# Patient Record
Sex: Female | Born: 1975 | Race: White | Hispanic: No | Smoking: Current every day smoker
Health system: Southern US, Community
[De-identification: ages and names within clinical notes are randomized; demographics above are authoritative.]

## PROBLEM LIST (undated history)

## (undated) DIAGNOSIS — F419 Anxiety disorder, unspecified: Secondary | ICD-10-CM

## (undated) DIAGNOSIS — A770 Spotted fever due to Rickettsia rickettsii: Secondary | ICD-10-CM

## (undated) DIAGNOSIS — R569 Unspecified convulsions: Secondary | ICD-10-CM

## (undated) DIAGNOSIS — R55 Syncope and collapse: Secondary | ICD-10-CM

## (undated) DIAGNOSIS — A6920 Lyme disease, unspecified: Secondary | ICD-10-CM

## (undated) HISTORY — DX: Spotted fever due to Rickettsia rickettsii: A77.0

## (undated) HISTORY — PX: CHOLECYSTECTOMY: SHX55

## (undated) HISTORY — DX: Lyme disease, unspecified: A69.20

---

## 1998-07-13 ENCOUNTER — Emergency Department (HOSPITAL_COMMUNITY): Admission: EM | Admit: 1998-07-13 | Discharge: 1998-07-13 | Payer: Self-pay | Admitting: Emergency Medicine

## 1998-08-28 ENCOUNTER — Other Ambulatory Visit: Admission: RE | Admit: 1998-08-28 | Discharge: 1998-08-28 | Payer: Self-pay | Admitting: Obstetrics

## 1998-12-20 ENCOUNTER — Inpatient Hospital Stay (HOSPITAL_COMMUNITY): Admission: AD | Admit: 1998-12-20 | Discharge: 1998-12-20 | Payer: Self-pay | Admitting: Obstetrics & Gynecology

## 1998-12-25 ENCOUNTER — Inpatient Hospital Stay (HOSPITAL_COMMUNITY): Admission: AD | Admit: 1998-12-25 | Discharge: 1998-12-25 | Payer: Self-pay | Admitting: Obstetrics

## 1999-05-12 ENCOUNTER — Inpatient Hospital Stay (HOSPITAL_COMMUNITY): Admission: AD | Admit: 1999-05-12 | Discharge: 1999-05-12 | Payer: Self-pay | Admitting: Obstetrics

## 2000-07-08 ENCOUNTER — Emergency Department (HOSPITAL_COMMUNITY): Admission: EM | Admit: 2000-07-08 | Discharge: 2000-07-08 | Payer: Self-pay | Admitting: Emergency Medicine

## 2001-01-03 ENCOUNTER — Inpatient Hospital Stay (HOSPITAL_COMMUNITY): Admission: AD | Admit: 2001-01-03 | Discharge: 2001-01-03 | Payer: Self-pay | Admitting: Obstetrics

## 2001-01-04 ENCOUNTER — Encounter: Payer: Self-pay | Admitting: Obstetrics

## 2001-01-04 ENCOUNTER — Ambulatory Visit (HOSPITAL_COMMUNITY): Admission: RE | Admit: 2001-01-04 | Discharge: 2001-01-04 | Payer: Self-pay | Admitting: Obstetrics

## 2001-05-01 ENCOUNTER — Encounter: Payer: Self-pay | Admitting: Emergency Medicine

## 2001-05-01 ENCOUNTER — Emergency Department (HOSPITAL_COMMUNITY): Admission: EM | Admit: 2001-05-01 | Discharge: 2001-05-01 | Payer: Self-pay | Admitting: Emergency Medicine

## 2001-05-07 ENCOUNTER — Emergency Department (HOSPITAL_COMMUNITY): Admission: EM | Admit: 2001-05-07 | Discharge: 2001-05-07 | Payer: Self-pay | Admitting: Emergency Medicine

## 2001-09-11 ENCOUNTER — Encounter: Payer: Self-pay | Admitting: *Deleted

## 2001-09-11 ENCOUNTER — Emergency Department (HOSPITAL_COMMUNITY): Admission: EM | Admit: 2001-09-11 | Discharge: 2001-09-11 | Payer: Self-pay | Admitting: Emergency Medicine

## 2002-02-08 ENCOUNTER — Emergency Department (HOSPITAL_COMMUNITY): Admission: EM | Admit: 2002-02-08 | Discharge: 2002-02-09 | Payer: Self-pay

## 2002-02-12 ENCOUNTER — Emergency Department (HOSPITAL_COMMUNITY): Admission: EM | Admit: 2002-02-12 | Discharge: 2002-02-12 | Payer: Self-pay | Admitting: Emergency Medicine

## 2002-02-12 ENCOUNTER — Encounter: Payer: Self-pay | Admitting: Emergency Medicine

## 2004-06-06 ENCOUNTER — Emergency Department (HOSPITAL_COMMUNITY): Admission: EM | Admit: 2004-06-06 | Discharge: 2004-06-06 | Payer: Self-pay | Admitting: Emergency Medicine

## 2004-07-18 ENCOUNTER — Emergency Department (HOSPITAL_COMMUNITY): Admission: EM | Admit: 2004-07-18 | Discharge: 2004-07-18 | Payer: Self-pay | Admitting: Emergency Medicine

## 2007-10-05 ENCOUNTER — Emergency Department (HOSPITAL_COMMUNITY): Admission: EM | Admit: 2007-10-05 | Discharge: 2007-10-06 | Payer: Self-pay | Admitting: Emergency Medicine

## 2007-10-15 ENCOUNTER — Emergency Department (HOSPITAL_COMMUNITY): Admission: EM | Admit: 2007-10-15 | Discharge: 2007-10-15 | Payer: Self-pay | Admitting: Emergency Medicine

## 2008-03-19 ENCOUNTER — Emergency Department (HOSPITAL_COMMUNITY): Admission: EM | Admit: 2008-03-19 | Discharge: 2008-03-19 | Payer: Self-pay | Admitting: Emergency Medicine

## 2009-02-16 ENCOUNTER — Emergency Department (HOSPITAL_COMMUNITY): Admission: EM | Admit: 2009-02-16 | Discharge: 2009-02-16 | Payer: Self-pay | Admitting: Emergency Medicine

## 2009-05-17 ENCOUNTER — Emergency Department (HOSPITAL_COMMUNITY): Admission: EM | Admit: 2009-05-17 | Discharge: 2009-05-18 | Payer: Self-pay | Admitting: Emergency Medicine

## 2010-05-05 ENCOUNTER — Encounter: Payer: Self-pay | Admitting: Sports Medicine

## 2011-01-15 LAB — COMPREHENSIVE METABOLIC PANEL
AST: 20 U/L (ref 0–37)
CO2: 26 mEq/L (ref 19–32)
Calcium: 9.4 mg/dL (ref 8.4–10.5)
Creatinine, Ser: 0.69 mg/dL (ref 0.4–1.2)
GFR calc Af Amer: >60 mL/min (ref >60)
GFR calc non Af Amer: >60 mL/min (ref >60)
Total Protein: 7.4 g/dL (ref 6.0–8.3)

## 2011-01-15 LAB — URINALYSIS, ROUTINE W REFLEX MICROSCOPIC
Bilirubin Urine: NEGATIVE
Leukocytes, UA: NEGATIVE
Nitrite: NEGATIVE
Specific Gravity, Urine: 1.03 (ref 1.005–1.030)
pH: 5.5 (ref 5.0–8.0)

## 2011-01-15 LAB — DIFFERENTIAL
Eosinophils Relative: 3 % (ref 0–5)
Lymphocytes Relative: 25 % (ref 12–46)
Lymphs Abs: 3.8 10*3/uL (ref 0.7–4.0)
Monocytes Relative: 7 % (ref 3–12)

## 2011-01-15 LAB — CBC
MCHC: 34.2 g/dL (ref 30.0–36.0)
MCV: 87.7 fL (ref 78.0–100.0)
Platelets: 227 10*3/uL (ref 150–400)
RBC: 5.34 MIL/uL — ABNORMAL HIGH (ref 3.87–5.11)
RDW: 13.8 % (ref 11.5–15.5)

## 2011-01-15 LAB — LIPASE, BLOOD: Lipase: 22 U/L (ref 11–59)

## 2011-01-15 LAB — URINE MICROSCOPIC-ADD ON

## 2016-12-05 ENCOUNTER — Emergency Department (HOSPITAL_COMMUNITY)
Admission: EM | Admit: 2016-12-05 | Discharge: 2016-12-05 | Disposition: A | Discharge status: Home / Self Care | Payer: Medicaid Other | Attending: Emergency Medicine | Admitting: Emergency Medicine

## 2016-12-05 ENCOUNTER — Encounter (HOSPITAL_COMMUNITY): Payer: Self-pay | Admitting: Emergency Medicine

## 2016-12-05 ENCOUNTER — Emergency Department (HOSPITAL_COMMUNITY): Payer: Medicaid Other

## 2016-12-05 DIAGNOSIS — M79641 Pain in right hand: Secondary | ICD-10-CM | POA: Diagnosis not present

## 2016-12-05 DIAGNOSIS — M25521 Pain in right elbow: Secondary | ICD-10-CM | POA: Insufficient documentation

## 2016-12-05 DIAGNOSIS — M25511 Pain in right shoulder: Secondary | ICD-10-CM | POA: Diagnosis present

## 2016-12-05 DIAGNOSIS — F17210 Nicotine dependence, cigarettes, uncomplicated: Secondary | ICD-10-CM | POA: Diagnosis not present

## 2016-12-05 DIAGNOSIS — I10 Essential (primary) hypertension: Secondary | ICD-10-CM | POA: Diagnosis not present

## 2016-12-05 HISTORY — DX: Syncope and collapse: R55

## 2016-12-05 HISTORY — DX: Anxiety disorder, unspecified: F41.9

## 2016-12-05 HISTORY — DX: Unspecified convulsions: R56.9

## 2016-12-05 MED ORDER — METHOCARBAMOL 500 MG PO TABS
500.0000 mg | ORAL_TABLET | Freq: Once | ORAL | Status: AC
  Administered 2016-12-05: 500 mg via ORAL
  Filled 2016-12-05: qty 1

## 2016-12-05 MED ORDER — OXYCODONE-ACETAMINOPHEN 5-325 MG PO TABS
2.0000 | ORAL_TABLET | Freq: Once | ORAL | Status: AC
  Administered 2016-12-05: 2 via ORAL
  Filled 2016-12-05: qty 2
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
## 2016-12-05 NOTE — Discharge Instructions (Signed)
We talked about your high blood pressure today, it is important to follow up with your primary care doctor given how high it is today. For your shoulder, please take over the counter tylenol and ibuprofen for pain, ice the area.

## 2016-12-05 NOTE — ED Notes (Signed)
Pt refusing discharge vitals.
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
## 2018-10-25 ENCOUNTER — Encounter: Payer: Self-pay | Admitting: Gastroenterology

## 2018-10-25 ENCOUNTER — Other Ambulatory Visit: Payer: Self-pay

## 2018-10-25 ENCOUNTER — Telehealth (INDEPENDENT_AMBULATORY_CARE_PROVIDER_SITE_OTHER): Payer: Medicaid Other | Admitting: Gastroenterology

## 2018-10-25 VITALS — Ht 66.0 in | Wt 228.0 lb

## 2018-10-25 DIAGNOSIS — K219 Gastro-esophageal reflux disease without esophagitis: Secondary | ICD-10-CM

## 2018-10-25 DIAGNOSIS — R634 Abnormal weight loss: Secondary | ICD-10-CM | POA: Diagnosis not present

## 2018-10-25 DIAGNOSIS — R197 Diarrhea, unspecified: Secondary | ICD-10-CM

## 2018-10-25 DIAGNOSIS — D72829 Elevated white blood cell count, unspecified: Secondary | ICD-10-CM | POA: Diagnosis not present

## 2018-10-25 MED ORDER — CHOLESTYRAMINE 4 G PO PACK: 4.0000 g | PACK | Freq: Every day | ORAL | 11 refills | Status: DC

## 2018-10-25 MED ORDER — NA SULFATE-K SULFATE-MG SULF 17.5-3.13-1.6 GM/177ML PO SOLN: 1.0000 | Freq: Once | ORAL | 0 refills | Status: AC

## 2018-10-25 NOTE — Patient Instructions (Addendum)
If you are age 45 or older, your body mass index should be between 23-30. Your Body mass index is 36.8 kg/m. If this is out of the aforementioned range listed, please consider follow up with your Primary Care Provider.  If you are age 25 or younger, your body mass index should be between 19-25. Your Body mass index is 36.8 kg/m. If this is out of the aformentioned range listed, please consider follow up with your Primary Care Provider.   To help prevent the possible spread of infection to our patients, communities, and staff; we will be implementing the following measures:  As of now we are not allowing any visitors/family members to accompany you to any upcoming appointments with [facility]. If you have any concerns about this please contact our office to discuss prior to the appointment.   Your provider has requested that you go to the basement level for lab work at our [place] location ([address] [place] [address]) . Press "B" on the elevator. The lab is located at the first door on the left as you exit the elevator. You may go at whatever time is convienent for you. The current hours of operations are Monday- Friday 7:30am-4:30pm.  It has been recommended to you by your physician that you have a(n) EGD/Colonoscopy completed. Per your request, we did not schedule the procedure(s) today. Please contact our office at [phone] should you decide to have the procedure completed.  We have sent the following medications to your pharmacy for you to pick up at your convenience: Suprep Questran 4gm pack once daily take 2 hours before or after other medications.  It was a pleasure to see you today!  Vito Cirigliano, D.O.

## 2018-10-25 NOTE — Progress Notes (Signed)
Chief Complaint: Diarrhea  Referring Provider:     Irven Shelling, MD   HPI:    Due to current restrictions/limitations of in-office visits due to the COVID-19 pandemic, this scheduled clinical appointment was converted to a telehealth virtual consultation using Doxim[employer]. Link failed on patient's phone, so converted to telephone appointment.   -Time of medical discussion: 26 minutes -The patient did consent to this virtual visit and is aware of possible charges through their insurance for this visit.  -Names of all parties present: Animal nutritionist (patient), Gerrit Heck, DO, [facility] (physician) -Patient location: Home -Physician location: Office  Veronica Roy is a 43 y.o. female with a history of COPD, CVA, asthma, discoid lupus erythematosus/SLE, fibromyalgia, GERD, hypertension, referred to the Gastroenterology Clinic for evaluation of diarrhea/loose stools.  Had lap ccy in 02/2013.  Acute onset watery, non-bloody stools 20 days ago. Does report a hx of IBS-D, strongly a/w stress, which had been present since teenage years. Colonoscopy in 1998 was reportedly normal and diagnosed with IBS-D. Sxs had been relatively well controlled with dietary mods.   Was reportedly diagnosed with [facility] Spotted Fever and Lyme Disease per patient. These labs are not available for review, but interestingly there is a note from ID stating there is no hx of Lyme based on review of 03/2018 labs (noted from 04/2018 review of labs). Labs and treatment previously at [facility]. Notes not available for review in EMR.  Currently being treated with doxycycline per patient.  Has trialed Imodium without improvement. Some improvement with Pepto. No hematochezia or melena. C/o abdominal distension. Rare mild abdominal soreness, without frank pain. No f/c/n/v. Increase flatus/belching. Improved with yogurt. Thinks she has lost 20# w/ sxs.    Labs from 08/2018 n/f WBC 17.4 with normal H/H,  platelets, normal TSH.  No recent abdominal imaging for review.  Hx of GERD and PUD in the past. Controlled with omeprazole 40 mg/day. No dysphagia. Does not recall any prior EGD.   Past medical history, past surgical history, social history, family history, medications, and allergies reviewed in the chart and with patient.    Past Medical History:  Diagnosis Date  . Anxiety   . Lyme disease   . [facility] spotted fever   . Seizures ([place])   . Syncope      Past Surgical History:  Procedure Laterality Date  . CHOLECYSTECTOMY     Family History  Problem Relation Age of Onset  . Colon cancer Maternal Grandmother    Social History   Tobacco Use  . Smoking status: Current Every Day Smoker    Packs/day: 0.50    Types: Cigarettes  . Smokeless tobacco: Never Used  Substance Use Topics  . Alcohol use: Not Currently    Comment: occasionally  . Drug use: No   Current Outpatient Medications  Medication Sig Dispense Refill  . albuterol (PROVENTIL) (2.5 MG/3ML) 0.083% nebulizer solution Inhale 2.5 mg into the lungs 3 (three) times daily.    . cetirizine (ZYRTEC) 10 MG tablet Take 10 mg by mouth daily.    Marland Kitchen HYDROcodone-Acetaminophen 10-300 MG TABS Take 1 tablet by mouth.    Marland Kitchen lisinopril-hydrochlorothiazide (ZESTORETIC) 20-25 MG tablet Take 0.5 tablets by mouth daily.    . norethindrone (MICRONOR) 0.35 MG tablet Take 0.35 mg by mouth daily.    Marland Kitchen omeprazole (PRILOSEC) 40 MG capsule Take 40 mg by mouth daily.    Marland Kitchen PARoxetine (PAXIL) 30  MG tablet Take 30 mg by mouth daily.    Marland Kitchen venlafaxine (EFFEXOR) 100 MG tablet Take 100 mg by mouth daily.     No current facility-administered medications for this visit.    Allergies  Allergen Reactions  . Ativan [Lorazepam] Anaphylaxis  . Codeine Anaphylaxis  . Penicillins Anaphylaxis  . Phenergan [Promethazine Hcl] Other (See Comments)    agitation     Review of Systems: All systems reviewed and negative except where noted in HPI.      Physical Exam:    Complete physical exam not completed due to the nature of this telehealth communication.   Gen: Awake, alert, and oriented, and well communicative. Psych: Pleasant, cooperative, normal speech, thought processing seemingly intact   ASSESSMENT AND PLAN;   1) Diarrhea: - Colonoscopy with random directed biopsies -EGD with duodenal biopsies - Check ESR, CRP, GI PCR panel, fecal calprotectin - Previous leukocytosis.  Repeat CBC -Check c-Met - TSH previously normal -Continue adequate hydration -Trial of Questran  2) Weight loss: -EGD and colonoscopy as above -Check micronutrient panel - If EGD/colonoscopy and labs unrevealing, may consider cross-sectional imaging  3) Leukocytosis: - Repeat CBC -Possibly related to reported [facility] spotted fever  4) GERD: -Well controlled with Prilosec.  No plan to change medical management this time -Can perform Barrett's esophagus screening at time of EGD as above  5) Possible History of [place] Spotted Fever: -Obtain records from [facility] office -Currently taking doxycycline per patient -Reviewed common GI symptomatology to include nausea/vomiting (not having), abdominal pain, and GI bleed (not having)  The indications, risks, and benefits of EGD and colonoscopy were explained to the patient in detail. Risks include but are not limited to bleeding, perforation, adverse reaction to medications, and cardiopulmonary compromise. Sequelae include but are not limited to the possibility of surgery, hositalization, and mortality. The patient verbalized understanding and wished to proceed. All questions answered, referred to scheduler and bowel prep ordered. Further recommendations pending results of the exam.      Lavena Bullion, DO, FACG  10/25/2018, 9:59 AM   Irven Shelling, MD
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
## 2019-02-17 DIAGNOSIS — D72829 Elevated white blood cell count, unspecified: Secondary | ICD-10-CM

## 2019-08-04 ENCOUNTER — Ambulatory Visit: Payer: Medicaid Other | Admitting: Gastroenterology

## 2020-08-15 ENCOUNTER — Telehealth: Payer: Self-pay

## 2020-08-15 ENCOUNTER — Encounter: Payer: Self-pay | Admitting: Allergy and Immunology

## 2020-08-15 ENCOUNTER — Other Ambulatory Visit: Payer: Self-pay

## 2020-08-15 ENCOUNTER — Ambulatory Visit: Payer: Medicaid Other | Admitting: Allergy and Immunology

## 2020-08-15 VITALS — BP 124/74 | HR 88 | Resp 16 | Ht 65.0 in | Wt 207.2 lb

## 2020-08-15 DIAGNOSIS — L500 Allergic urticaria: Secondary | ICD-10-CM | POA: Diagnosis not present

## 2020-08-15 DIAGNOSIS — Z7722 Contact with and (suspected) exposure to environmental tobacco smoke (acute) (chronic): Secondary | ICD-10-CM

## 2020-08-15 DIAGNOSIS — J449 Chronic obstructive pulmonary disease, unspecified: Secondary | ICD-10-CM | POA: Diagnosis not present

## 2020-08-15 DIAGNOSIS — D7219 Other eosinophilia: Secondary | ICD-10-CM

## 2020-08-15 DIAGNOSIS — J3089 Other allergic rhinitis: Secondary | ICD-10-CM

## 2020-08-15 DIAGNOSIS — K219 Gastro-esophageal reflux disease without esophagitis: Secondary | ICD-10-CM

## 2020-08-15 DIAGNOSIS — D892 Hypergammaglobulinemia, unspecified: Secondary | ICD-10-CM

## 2020-08-15 MED ORDER — NYSTATIN 100000 UNIT/ML MT SUSP
OROMUCOSAL | 5 refills | Status: DC
Start: 2020-08-15 — End: 2021-01-16

## 2020-08-15 MED ORDER — BREZTRI AEROSPHERE 160-9-4.8 MCG/ACT IN AERO: INHALATION_SPRAY | RESPIRATORY_TRACT | 5 refills | Status: DC

## 2020-08-15 MED ORDER — FAMOTIDINE 40 MG PO TABS: 40.0000 mg | ORAL_TABLET | Freq: Every evening | ORAL | 5 refills | Status: DC

## 2020-08-15 NOTE — Telephone Encounter (Signed)
-----   Message from Anamaria Dusenbury Priest, MD sent at 08/15/2020 12:10 PM EDT ----- Please inform Veronica Roy that I looked at her blood tests.  She needs to get an additional blood test in investigation of her elevated globulin level.  As well, we need to provide her allergen avoidance measures against dust mite and pollens.  And she needs to have a talk with her fianc about having him smoke outdoors and not inside the household when she is inside.

## 2020-08-15 NOTE — Progress Notes (Signed)
[place] [facility] [place] [facility] [place] - [place] - [place]   Dear Dr. London Pepper,  Thank you for referring Veronica Roy to the [facility] on 08/15/2020.   Below is a summation of this patient's evaluation and recommendations.  Thank you for your referral. I will keep you informed about this patient's response to treatment.   If you have any questions please do not hesitate to contact me.   Sincerely,  Jiles Prows, MD Allergy / Immunology [place]   ______________________________________________________________________    NEW PATIENT NOTE  Referring Provider: Bonnita Nasuti, MD Primary Provider: Bonnita Nasuti, MD Date of office visit: 08/15/2020    Subjective:   Chief Complaint:  Veronica Roy (DOB: August 04, 1975) is a 44 y.o. female who presents to the clinic on 08/15/2020 with a chief complaint of Allergic Rhinitis  and Asthma .     HPI: Veronica Roy presents to this clinic in evaluation of several issues.  First, Veronica Roy has been having red raised itchy lesions across her body that never heal with scar or hyperpigmentation that are intensely itchy.  These started approximately 1 month ago while Veronica Roy started at her new job.  There is no associated systemic or constitutional symptoms.  Veronica Roy is taking Zyrtec twice a day but still continues to have her urticaria.  Veronica Roy cannot really identify a etiologic factor responsible for these hives.  Second, Veronica Roy has unrelenting coughing episodes and wheezing even in the face of using Advair, Spiriva, and montelukast.  Her requirement for short acting bronchodilator is 6 times per day.  Veronica Roy has been stuck in a pattern for years.  Veronica Roy stopped smoking about 3 months ago but Veronica Roy still has secondhand tobacco smoke exposure from her fianc.  Veronica Roy also gets recurrent thrush from the use of her combination inhaler.  Third, Veronica Roy states that Veronica Roy has Lyme's disease.   12 times over the course of the past 3 years Veronica Roy has required the administration of doxycycline for a "flareup".  It is hard for her to define a flareup but it sounds as though Veronica Roy might get a flulike illness and a bad headache and body aches.  Fourth, Veronica Roy gets sick to her stomach with vomiting and diarrhea within 30 minutes if Veronica Roy eats "red meat".  Fifth, Veronica Roy has very bad reflux even while using omeprazole 40 mg a day.  Veronica Roy regurgitates and Veronica Roy throws up at least 1 time per day.  Veronica Roy does not consume any caffeine.  Sixth, he has nasal congestion and sneezing.  Veronica Roy was given Flonase in the past but it precipitated a very bad headache.  Veronica Roy recently had a right shoulder injury involving her bicep tendon and is undergoing evaluation for this issue.  Past Medical History:  Diagnosis Date  . Anxiety   . Lyme disease   . [facility] spotted fever   . Seizures ([place])   . Syncope     Past Surgical History:  Procedure Laterality Date  . CHOLECYSTECTOMY      Allergies as of 08/15/2020      Reactions   Ativan [lorazepam] Anaphylaxis   Codeine Anaphylaxis   Penicillins Anaphylaxis   Phenergan [promethazine Hcl] Other (See Comments)   agitation      Medication List    Accu-Chek FastClix Lancets Misc USE 1 LANCET TO CHECK GLUCOSE ONCE DAILY   Accu-Chek Guide test strip Generic drug: glucose blood daily.  albuterol (2.5 MG/3ML) 0.083% nebulizer solution Commonly known as: PROVENTIL Inhale 2.5 mg into the lungs 3 (three) times daily.   ProAir HFA 108 (90 Base) MCG/ACT inhaler Generic drug: albuterol SMARTSIG:1 Puff(s) Via Inhaler Every 4 Hours PRN   cetirizine 10 MG tablet Commonly known as: ZYRTEC Take 10 mg by mouth daily.   cyclobenzaprine 10 MG tablet Commonly known as: FLEXERIL 1 tablet as needed   fluticasone-salmeterol 500-50 MCG/ACT Aepb Commonly known as: ADVAIR 1 puff   HYDROcodone-Acetaminophen 10-300 MG Tabs Take 1 tablet by mouth.    ipratropium-albuterol 0.5-2.5 (3) MG/3ML Soln Commonly known as: DUONEB USE 1 AMPULE IN NEBULIZER EVERY 6 HOURS   lisinopril-hydrochlorothiazide 20-25 MG tablet Commonly known as: ZESTORETIC Take 0.5 tablets by mouth daily.   metFORMIN 500 MG 24 hr tablet Commonly known as: GLUCOPHAGE-XR SMARTSIG:1 Tablet(s) By Mouth Every Evening   montelukast 10 MG tablet Commonly known as: SINGULAIR Take 1 tablet by mouth daily.   omeprazole 40 MG capsule Commonly known as: PRILOSEC Take 40 mg by mouth daily.   ondansetron 8 MG tablet Commonly known as: ZOFRAN Take 8 mg by mouth 2 (two) times daily.   PARoxetine 30 MG tablet Commonly known as: PAXIL Take 30 mg by mouth daily.   Pharmacist Choice Autocode Sys w/Device Kit check blood sugar QD   potassium chloride 10 MEQ tablet Commonly known as: KLOR-CON 1 tablet with food   Spiriva HandiHaler 18 MCG inhalation capsule Generic drug: tiotropium 1 capsule by inhaling the contents of the capsule using the HandiHaler device   SUMAtriptan 50 MG tablet Commonly known as: IMITREX 1 tablet at least 2 hours between doses as needed   topiramate 25 MG tablet Commonly known as: TOPAMAX 1 tablet   triamcinolone cream 0.1 % Commonly known as: KENALOG Apply topically.   venlafaxine 100 MG tablet Commonly known as: EFFEXOR Take 100 mg by mouth daily.   Vitamin D (Ergocalciferol) 1.25 MG (50000 UNIT) Caps capsule Commonly known as: DRISDOL 1 capsule       Review of systems negative except as noted in HPI / PMHx or noted below:  Review of Systems  Constitutional: Negative.   HENT: Negative.   Eyes: Negative.   Respiratory: Negative.   Cardiovascular: Negative.   Gastrointestinal: Negative.   Genitourinary: Negative.   Musculoskeletal: Negative.   Skin: Negative.   Neurological: Negative.   Endo/Heme/Allergies: Negative.   Psychiatric/Behavioral: Negative.     Family History  Problem Relation Age of Onset  . Colon  cancer Maternal Grandmother   . Allergic rhinitis Maternal Grandmother   . Asthma Maternal Grandmother   . Asthma Mother   . Allergic rhinitis Mother   . Asthma Father   . Allergic rhinitis Father   . Allergic rhinitis Maternal Grandfather   . Asthma Maternal Grandfather   . Allergic rhinitis Paternal Grandmother   . Asthma Paternal Grandmother   . Allergic rhinitis Paternal Grandfather   . Asthma Paternal Grandfather   . Angioedema Neg Hx   . Atopy Neg Hx   . Eczema Neg Hx   . Urticaria Neg Hx   . Immunodeficiency Neg Hx     Social History   Socioeconomic History  . Marital status: Divorced    Spouse name: Not on file  . Number of children: Not on file  . Years of education: Not on file  . Highest education level: Not on file  Occupational History  . Not on file  Tobacco Use  . Smoking status: Former Smoker  Types: Cigarettes  . Smokeless tobacco: Never Used  Vaping Use  . Vaping Use: Never used  Substance and Sexual Activity  . Alcohol use: Not Currently    Comment: occasionally  . Drug use: No  . Sexual activity: Not on file  Other Topics Concern  . Not on file  Social History Narrative  . Not on file   Environmental and Social history  Lives in a mobile home with a dry environment, dog located inside the household, no carpet in the bedroom, no plastic on the bed, no plastic on the pillow, exposed to tobacco smoke exposure inside the household, and employment at a [employer].  Objective:   Vitals:   08/15/20 0923  BP: 124/74  Pulse: 88  Resp: 16  SpO2: 98%   Height: 5' 5" (165.1 cm) Weight: 207 lb 3.2 oz (94 kg)  Physical Exam Constitutional:      Appearance: Veronica Roy is not diaphoretic.  HENT:     Head: Normocephalic. No right periorbital erythema or left periorbital erythema.     Right Ear: Tympanic membrane, ear canal and external ear normal.     Left Ear: Tympanic membrane, ear canal and external ear normal.     Nose: Nose normal. No  mucosal edema or rhinorrhea.     Mouth/Throat:     Pharynx: No oropharyngeal exudate.  Eyes:     General: Lids are normal.     Conjunctiva/sclera: Conjunctivae normal.     Pupils: Pupils are equal, round, and reactive to light.  Neck:     Thyroid: No thyromegaly.     Trachea: Trachea normal. No tracheal deviation.  Cardiovascular:     Rate and Rhythm: Normal rate and regular rhythm.     Heart sounds: Normal heart sounds, S1 normal and S2 normal. No murmur heard.   Pulmonary:     Effort: Pulmonary effort is normal. No respiratory distress.     Breath sounds: No stridor. No wheezing (Bilateral inspiratory and expiratory wheezes) or rales.  Chest:     Chest wall: No tenderness.  Abdominal:     General: There is no distension.     Palpations: Abdomen is soft. There is no mass.     Tenderness: There is no abdominal tenderness. There is no guarding or rebound.  Musculoskeletal:        General: No tenderness.  Lymphadenopathy:     Head:     Right side of head: No tonsillar adenopathy.     Left side of head: No tonsillar adenopathy.     Cervical: No cervical adenopathy.  Skin:    Coloration: Skin is not pale.     Findings: Rash (Blanching urticarial lesions trunk) present. No erythema.     Nails: There is no clubbing.  Neurological:     Mental Status: Veronica Roy is alert.     Diagnostics: Allergy skin tests were not performed secondary to recent antihistamine use.   Spirometry was performed and demonstrated an FEV1 of 1.82 @ 62 % of predicted. FEV1/FVC = 0.79  Results of blood tests obtained 02 August 2020 identifies IgE antibodies directed against wheat 0.18, corn 0.11, peanut 0.12, Timothy grass 0.23, sheep Sorrell 0.38, cats 0.13, dog 0.35, cockroach 0.32, hickory/pecan 0.11, mouse 11.80, dust mite 1.95, Berm[place] grass 0.30, [place] bluegrass 0.23, Johnson grass 0.20, [place] 0.24, maple 0.14, oak 0.16, almond 0.15, hickory 0.14, ragweed 0.25, English planting 0.26, pigweed 0.12, [place]  0.43, oyster 0.19, scallop 0.11 KU/L.  Negative IgE to beef, pork.    Results of a blood tests with additional testing obtained on 02 August 2020 identified WBC 16.1, absolute eosinophil 500, absolute basophil 100, absolute lymphocyte 2900, hemoglobin 11.5, platelet 313, TSH 2.560 IU/mL, free T4 1.09 NG/DL  Results of blood tests obtained 21 December 2019 identifies total protein 9.5G/DL, globulin 5.7G/DL albumin 3.8G/DL, AST 15 U/L, ALT 19 U/L, creatinine 1.43 mg/DL  Assessment and Plan:    1. COPD with asthma (HCC)   2. Perennial allergic rhinitis   3. Allergic urticaria   4. LPRD (laryngopharyngeal reflux disease)   5. Hypergammaglobulinemia   6. Other eosinophilia   7. Secondhand smoke exposure     1.  Allergen avoidance measures - Dust mite, pollen, second hand smoke exposure  2.  Treat inflammation of airway:   A. Breztri - 2 inhalations 2 times per day with spacer (empty lungs)  B. No Advair. No Spiriva  C. Nasonex - 1 spray each nostril 2 times per day  D. Montelukast 10 mg - 1 tablet 1 time per day  3.  Treat thrush:   A. Nystatin - 5 mls swish and swallow after Breztri use 2 times per day  4.  Treat reflux:   A. Omeprezole 40 mg - 1 tablet 2 times per day  B. Famotidine 40 mg - 1 tablet in evening  5.  Treat overactive immune system:   A. Cetirizine (Zyrtec) 10 mg - 1-2 tablets 1-2 times per day (MAX=40 mg/day)  6.  If needed:   A. Albuterol HFA - 2 inhalations every 4-6 hours  7.  Blood - SPEP with immunofix, thyroid peroxidase, alpha-gal IgE  8.  Return to clinic in 3 weeks or earlier if problem  Anaria has immune activation manifested as urticaria and respiratory tract inflammation.  We will get her to perform allergen avoidance measures as best as possible and use a combination of high-dose H1 receptor blocker as well as anti-inflammatory agents for her airway on a consistent basis.  Because of her previous history of recurrent thrush we will have her use  nystatin after using her triple inhaler.  Veronica Roy also appears to have significant reflux with daily emesis and we will treat her with a combination of high-dose proton pump inhibitor and H2 receptor blocker.  If Veronica Roy still continues to have recurrent emesis then Veronica Roy certainly needs further evaluation for possible eosinophilic esophagitis.  We will complete her analysis of a systemic disease contributing to her immune activation with the blood test noted above.  Veronica Roy does have hypergammaglobulinemia and we will further explore that issue with an SPEP with reflex to immunofixation.  I will see her back in this clinic in 3 weeks to assess her response to this approach and consider further evaluation and treatment based upon her response and the results of her diagnostic testing.  Eric J. Kozlow, MD Allergy / Immunology [facility] 

## 2020-08-15 NOTE — Telephone Encounter (Signed)
Patient was informed and verbalized understanding. She will go to [facility] and get blood work done as soon as she is able to. Avoidance measures have been provided for patient.

## 2020-08-15 NOTE — Patient Instructions (Addendum)
  1.  Allergen avoidance measures - Dust mite, pollen, second hand smoke exposure  2.  Treat inflammation of airway:   A. Breztri - 2 inhalations 2 times per day with spacer (empty lungs)  B. No Advair. No Spiriva  C. Nasonex - 1 spray each nostril 2 times per day  D. Montelukast 10 mg - 1 tablet 1 time per day  3.  Treat thrush:   A. Nystatin - 5 mls swish and swallow after Breztri use 2 times per day  4.  Treat reflux:   A. Omeprezole 40 mg - 1 tablet 2 times per day  B. Famotidine 40 mg - 1 tablet in evening  5.  Treat overactive immune system:   A. Cetirizine (Zyrtec) 10 mg - 1-2 tablets 1-2 times per day (MAX=40 mg/day)  6.  If needed:   A. Albuterol HFA - 2 inhalations every 4-6 hours  7.  Blood - SPEP with immunofix, thyroid peroxidase, alpha-gal IgE  8.  Return to clinic in 3 weeks or earlier if problem

## 2020-08-16 ENCOUNTER — Encounter: Payer: Self-pay | Admitting: Allergy and Immunology

## 2020-08-24 LAB — PROTEIN ELECTROPHORESIS, SERUM, WITH REFLEX
A/G Ratio: 0.7 (ref 0.7–1.7)
Albumin ELP: 3.2 g/dL (ref 2.9–4.4)
Alpha 1: 0.4 g/dL (ref 0.0–0.4)
Alpha 2: 1.2 g/dL — ABNORMAL HIGH (ref 0.4–1.0)
Beta: 1.6 g/dL — ABNORMAL HIGH (ref 0.7–1.3)
Gamma Globulin: 1.8 g/dL (ref 0.4–1.8)
Globulin, Total: 4.9 g/dL — ABNORMAL HIGH (ref 2.2–3.9)
Total Protein: 8.1 g/dL (ref 6.0–8.5)

## 2020-08-24 LAB — ALPHA-GAL PANEL
Allergen Lamb IgE: <0.1 kU/L
Beef IgE: <0.1 kU/L
IgE (Immunoglobulin E), Serum: 1063 IU/mL — ABNORMAL HIGH (ref 6–495)
O215-IgE Alpha-Gal: <0.1 kU/L
Pork IgE: <0.1 kU/L

## 2020-08-24 LAB — THYROID PEROXIDASE ANTIBODY: Thyroperoxidase Ab SerPl-aCnc: 10 IU/mL (ref 0–34)

## 2020-08-29 ENCOUNTER — Ambulatory Visit: Payer: Medicaid Other | Admitting: Allergy and Immunology

## 2020-08-29 ENCOUNTER — Encounter: Payer: Self-pay | Admitting: Allergy and Immunology

## 2020-08-29 ENCOUNTER — Other Ambulatory Visit: Payer: Self-pay

## 2020-08-29 VITALS — BP 118/62 | HR 96 | Resp 16

## 2020-08-29 DIAGNOSIS — D7219 Other eosinophilia: Secondary | ICD-10-CM

## 2020-08-29 DIAGNOSIS — J449 Chronic obstructive pulmonary disease, unspecified: Secondary | ICD-10-CM

## 2020-08-29 DIAGNOSIS — J3089 Other allergic rhinitis: Secondary | ICD-10-CM | POA: Diagnosis not present

## 2020-08-29 DIAGNOSIS — K219 Gastro-esophageal reflux disease without esophagitis: Secondary | ICD-10-CM

## 2020-08-29 DIAGNOSIS — Z7722 Contact with and (suspected) exposure to environmental tobacco smoke (acute) (chronic): Secondary | ICD-10-CM

## 2020-08-29 DIAGNOSIS — L500 Allergic urticaria: Secondary | ICD-10-CM

## 2020-08-29 DIAGNOSIS — B360 Pityriasis versicolor: Secondary | ICD-10-CM

## 2020-08-29 MED ORDER — OMEPRAZOLE 40 MG PO CPDR: DELAYED_RELEASE_CAPSULE | ORAL | 5 refills | Status: DC

## 2020-08-29 MED ORDER — FLUCONAZOLE 150 MG PO TABS: ORAL_TABLET | ORAL | 0 refills | Status: DC

## 2020-08-29 NOTE — Patient Instructions (Addendum)
  1.  Allergen avoidance measures - Dust mite, pollen, second hand smoke exposure  2.  Treat inflammation of airway:   A. START Breztri - 2 inhalations 2 times per day with spacer (empty lungs)  B. START NASACORT (Nasonex) - 1 spray each nostril 2 times per day  D. Montelukast 10 mg - 1 tablet 1 time per day  3.  Treat thrush:   A. Nystatin - 5 mls swish and swallow after Breztri use 2 times per day  4.  Treat reflux:   A. Omeprezole 40 mg - 1 tablet 2 times per day  B. START Famotidine 40 mg - 1 tablet in evening  5.  Treat overactive immune system:   A. Cetirizine (Zyrtec) 10 mg - 1-2 tablets 1-2 times per day (MAX=40 mg/day)  6. Treat tinea versicolor:   A. Diflucan 150 - 1 tablet 1 TIME PER WEEK for 3 weeks  7.  If needed:   A. Albuterol HFA - 2 inhalations every 4-6 hours  8.  Return to clinic in 3 weeks or earlier if problem

## 2020-08-29 NOTE — Progress Notes (Signed)
[place] - [place] - [place]   Follow-up Note  Referring Provider: Bonnita Nasuti, MD Primary Provider: Bonnita Nasuti, MD Date of Office Visit: 08/29/2020  Subjective:   Veronica Roy (DOB: [birth year]) is a 45 y.o. female who returns to the [facility] [place] on 08/29/2020 in re-evaluation of the following:  HPI: Sharina presents to this clinic in evaluation of multiple issues addressed during her initial evaluation of 15 Aug 2020 including COPD with asthma, allergic rhinitis, urticaria, LPR, thrush, eosinophilia, hypergammaglobulinemia, and secondhand tobacco smoke exposure.  Her lungs are about the same with lots of wheezing and coughing.  She did not start her triple inhaler as she just picked up the prescription yesterday.  Her nose is still congested.  She cannot tolerate Nasonex because it gave rise to a migraine headache.  She continues on montelukast.  She has not had any issues with thrush while using nystatin.  She continues to have regurgitation and vomiting on almost a daily basis.  She is only using her omeprazole once a day and has not started famotidine.  Her "hives" are becoming much worse.  She is only using her cetirizine at a dose of 20 mg daily  Allergies as of 08/29/2020      Reactions   Ativan [lorazepam] Anaphylaxis   Codeine Anaphylaxis   Penicillins Anaphylaxis   Phenergan [promethazine Hcl] Other (See Comments)   agitation      Medication List      Accu-Chek FastClix Lancets Misc USE 1 LANCET TO CHECK GLUCOSE ONCE DAILY   Accu-Chek Guide test strip Generic drug: glucose blood daily.   albuterol (2.5 MG/3ML) 0.083% nebulizer solution Commonly known as: PROVENTIL Inhale 2.5 mg into the lungs 3 (three) times daily.   ProAir HFA 108 (90 Base) MCG/ACT inhaler Generic drug: albuterol SMARTSIG:1 Puff(s) Via Inhaler Every 4 Hours PRN   BIOTIN PO Take by mouth.   Breztri Aerosphere 160-9-4.8 MCG/ACT  Aero Generic drug: Budeson-Glycopyrrol-Formoterol 2 puffs 2 times per day with spacer   cetirizine 10 MG tablet Commonly known as: ZYRTEC Take 10 mg by mouth daily.   cyclobenzaprine 10 MG tablet Commonly known as: FLEXERIL 1 tablet as needed   famotidine 40 MG tablet Commonly known as: PEPCID Take 1 tablet (40 mg total) by mouth every evening.   ipratropium-albuterol 0.5-2.5 (3) MG/3ML Soln Commonly known as: DUONEB USE 1 AMPULE IN NEBULIZER EVERY 6 HOURS   lidocaine 5 % Commonly known as: LIDODERM SMARTSIG:3 Patch(s) Topical Every 12 Hours   lisinopril-hydrochlorothiazide 20-25 MG tablet Commonly known as: ZESTORETIC Take 0.5 tablets by mouth daily.   metFORMIN 500 MG 24 hr tablet Commonly known as: GLUCOPHAGE-XR SMARTSIG:1 Tablet(s) By Mouth Every Evening   montelukast 10 MG tablet Commonly known as: SINGULAIR Take 1 tablet by mouth daily.   MUCINEX PO Take by mouth.   nystatin 100000 UNIT/ML suspension Commonly known as: MYCOSTATIN 5 mls swish and swallow after Breztri use 2 times per day   omeprazole 40 MG capsule Commonly known as: PRILOSEC Take 40 mg by mouth daily.   ondansetron 8 MG tablet Commonly known as: ZOFRAN Take 8 mg by mouth 2 (two) times daily.   PARoxetine 30 MG tablet Commonly known as: PAXIL Take 30 mg by mouth daily.   Pharmacist Choice Autocode Sys w/Device Kit check blood sugar QD   PROBIOTIC PO Take by mouth.   Vitamin D (Ergocalciferol) 1.25 MG (50000 UNIT) Caps capsule Commonly known as: DRISDOL Take  2 capsules by mouth once a week.       Past Medical History:  Diagnosis Date  . Anxiety   . Lyme disease   . [facility] spotted fever   . Seizures ([place])   . Syncope     Past Surgical History:  Procedure Laterality Date  . CHOLECYSTECTOMY      Review of systems negative except as noted in HPI / PMHx or noted below:  Review of Systems  Constitutional: Negative.   HENT: Negative.   Eyes: Negative.    Respiratory: Negative.   Cardiovascular: Negative.   Gastrointestinal: Negative.   Genitourinary: Negative.   Musculoskeletal: Negative.   Skin: Negative.   Neurological: Negative.   Endo/Heme/Allergies: Negative.   Psychiatric/Behavioral: Negative.      Objective:   Vitals:   08/29/20 1135  BP: 118/62  Pulse: 96  Resp: 16  SpO2: 95%          Physical Exam Constitutional:      Appearance: She is not diaphoretic.  HENT:     Head: Normocephalic.     Right Ear: Tympanic membrane, ear canal and external ear normal.     Left Ear: Tympanic membrane, ear canal and external ear normal.     Nose: Nose normal. No mucosal edema or rhinorrhea.     Mouth/Throat:     Pharynx: Uvula midline. No oropharyngeal exudate.  Eyes:     Conjunctiva/sclera: Conjunctivae normal.  Neck:     Thyroid: No thyromegaly.     Trachea: Trachea normal. No tracheal tenderness or tracheal deviation.  Cardiovascular:     Rate and Rhythm: Normal rate and regular rhythm.     Heart sounds: Normal heart sounds, S1 normal and S2 normal. No murmur heard.   Pulmonary:     Effort: No respiratory distress.     Breath sounds: Normal breath sounds. No stridor. No wheezing or rales.  Lymphadenopathy:     Head:     Right side of head: No tonsillar adenopathy.     Left side of head: No tonsillar adenopathy.     Cervical: No cervical adenopathy.  Skin:    Findings: Rash (Multiple slightly erythematous oval-shaped lesions with some serpiginous lesions approximately 1 cm diameter with slight scale involving trunk.) present. No erythema.     Nails: There is no clubbing.  Neurological:     Mental Status: She is alert.     Diagnostics:    Results of blood tests obtained 16 Aug 2020 identified a SPEP identifying elevation in band alpha-2 and beta with a gammaglobulin at 1.8G/DL signifying acute phase reactants rather than hypergammaglobulinemia associated with a monoclonal protein, thyroid peroxidase antibody 10  U/mL, IgE 1063 KU/L, negative alpha gal panel  Assessment and Plan:   1. COPD with asthma ([place])   2. Perennial allergic rhinitis   3. Allergic urticaria   4. Tinea versicolor   5. LPRD (laryngopharyngeal reflux disease)   6. Other eosinophilia   7. Secondhand smoke exposure     1.  Allergen avoidance measures - Dust mite, pollen, second hand smoke exposure  2.  Treat inflammation of airway:   A. START Breztri - 2 inhalations 2 times per day with spacer (empty lungs)  B. START NASACORT (Nasonex) - 1 spray each nostril 2 times per day  D. Montelukast 10 mg - 1 tablet 1 time per day  3.  Treat thrush:   A. Nystatin - 5 mls swish and swallow after Breztri use 2 times per day  4.  Treat reflux:   A. Omeprezole 40 mg - 1 tablet 2 times per day  B. START Famotidine 40 mg - 1 tablet in evening  5.  Treat overactive immune system:   A. Cetirizine (Zyrtec) 10 mg - 1-2 tablets 1-2 times per day (MAX=40 mg/day)  6. Treat tinea versicolor:   A. Diflucan 150 - 1 tablet 1 TIME PER WEEK for 3 weeks  7.  If needed:   A. Albuterol HFA - 2 inhalations every 4-6 hours  8.  Return to clinic in 3 weeks or earlier if problem  Were going to have Ter[place]a start the medications that we originally suggested using during her last visit.  She will initiate use of her triple inhaler and we will have her use a nasal steroid and will have her become a little bit more aggressive about treating her reflux disease as noted above.  As well, her skin appearance now appears to be very consistent with tinea versicolor or possible pityriasis rosea.  We will treat her empirically for tinea versicolor with Diflucan at relatively low dose which is 150 mg once a week for the next 3 weeks.  I will regroup with her that point in time to consider further evaluation and treatment based upon her response to this multi pronged approach to all of her issues.  Allena Katz, MD Allergy / Immunology [place]

## 2020-09-02 ENCOUNTER — Encounter: Payer: Self-pay | Admitting: Allergy and Immunology

## 2020-09-05 ENCOUNTER — Ambulatory Visit: Payer: Medicaid Other | Admitting: Allergy and Immunology

## 2020-09-19 ENCOUNTER — Ambulatory Visit: Payer: Medicaid Other | Admitting: Allergy and Immunology

## 2020-10-24 ENCOUNTER — Other Ambulatory Visit: Payer: Self-pay

## 2020-10-24 ENCOUNTER — Ambulatory Visit: Payer: Medicaid Other | Admitting: Allergy and Immunology

## 2020-10-24 ENCOUNTER — Encounter: Payer: Self-pay | Admitting: Allergy and Immunology

## 2020-10-24 VITALS — BP 98/80 | HR 92 | Resp 16

## 2020-10-24 DIAGNOSIS — L500 Allergic urticaria: Secondary | ICD-10-CM | POA: Diagnosis not present

## 2020-10-24 DIAGNOSIS — J3089 Other allergic rhinitis: Secondary | ICD-10-CM | POA: Diagnosis not present

## 2020-10-24 DIAGNOSIS — J4489 Other specified chronic obstructive pulmonary disease: Secondary | ICD-10-CM

## 2020-10-24 DIAGNOSIS — D7219 Other eosinophilia: Secondary | ICD-10-CM

## 2020-10-24 DIAGNOSIS — J449 Chronic obstructive pulmonary disease, unspecified: Secondary | ICD-10-CM | POA: Diagnosis not present

## 2020-10-24 DIAGNOSIS — B360 Pityriasis versicolor: Secondary | ICD-10-CM

## 2020-10-24 DIAGNOSIS — K219 Gastro-esophageal reflux disease without esophagitis: Secondary | ICD-10-CM

## 2020-10-24 DIAGNOSIS — F17210 Nicotine dependence, cigarettes, uncomplicated: Secondary | ICD-10-CM | POA: Diagnosis not present

## 2020-10-24 MED ORDER — FAMOTIDINE 40 MG PO TABS: 40.0000 mg | ORAL_TABLET | Freq: Every evening | ORAL | 5 refills | Status: DC

## 2020-10-24 MED ORDER — SPIRIVA RESPIMAT 1.25 MCG/ACT IN AERS: INHALATION_SPRAY | RESPIRATORY_TRACT | 5 refills | Status: DC

## 2020-10-24 MED ORDER — SYMBICORT 160-4.5 MCG/ACT IN AERO
INHALATION_SPRAY | RESPIRATORY_TRACT | 5 refills | Status: DC
Start: 2020-10-24 — End: 2022-02-27

## 2020-10-24 NOTE — Patient Instructions (Addendum)
  1.  Allergen avoidance measures - Dust mite, pollen, second hand smoke exposure  2.  Treat and prevent inflammation of airway:   A. START Symbicort 160 - 2 inhalations 2 times per day w/ spacer (empty lungs)  B. START Spiriva 1.25 Respimat - 2 inhalations 1 time per day  C. START Tezepelumab injections today and every 4 weeks  B. NASACORT - 1 spray each nostril few times per week  D. Montelukast 10 mg - 1 tablet 1 time per day  3.  Treat and prevent thrush:   A. Nystatin - 5 mls swish and swallow after Symbicort use    4.  Treat and prevent reflux:   A. Omeprezole 40 mg - 1 tablet 2 times per day  B. START Famotidine 40 mg - 1 tablet in evening  5.  Treat overactive immune system:   A. Cetirizine 10 mg - 1-2 tablets 1-2 times per day (MAX=40 mg/day)  6. If needed:   A. Albuterol HFA - 2 inhalations every 4-6 hours  7.  Return to clinic in 12 weeks or earlier if problem  8. Obtain fall flu vaccine

## 2020-10-24 NOTE — Progress Notes (Signed)
[facility] - [place] [facility] [place]   Follow-up Note  Referring Provider: Bonnita Nasuti, MD Primary Provider: Bonnita Nasuti, MD Date of Office Visit: 10/24/2020  Subjective:   Veronica Roy (DOB: 29-Jun-1975) is a 45 y.o. female who returns to the [facility] on 10/24/2020 in re-evaluation of the following:  HPI: [name] returns to this clinic in evaluation of COPD with asthma, allergic rhinitis, urticaria, LPR, history of thrush, eosinophilia, hypergammaglobulinemia, and secondhand tobacco smoke exposure and history of tinea versicolor.  Her last visit to this clinic was 29 Aug 2020.  She still continues to wheeze and cough and she has been using a bronchodilator every 4 hours.  She is smoking approximately 2 to 3 cigarettes/day.  She has had some problem utilizing her controller agents because they give rise to GI upset and headache.  She has been using oxygen about three quarters of the day.  She continues to have lots of problems with nasal congestion.  She has been having problems using her nasal steroid because it gives rise to a headache and she is now using it just a few times per week.  She continues to have problems with regurgitation and vomiting on almost a daily basis.  For some reason she did not obtain famotidine.  We cannot really tell if this is an insurance issue or what it issue it is but she is basically just using her omeprazole.  Her hives are actually doing relatively well.  Her tinea versicolor has resolved with the Diflucan.  Allergies as of 10/24/2020       Reactions   Ativan [lorazepam] Anaphylaxis   Codeine Anaphylaxis   Penicillins Anaphylaxis   Phenergan [promethazine Hcl] Other (See Comments)   agitation        Medication List     Accu-Chek FastClix Lancets Misc USE 1 LANCET TO CHECK GLUCOSE ONCE DAILY   Accu-Chek Guide test strip Generic drug: glucose blood daily.   albuterol (2.5 MG/3ML) 0.083%  nebulizer solution Commonly known as: PROVENTIL Inhale 2.5 mg into the lungs 3 (three) times daily.   ProAir HFA 108 (90 Base) MCG/ACT inhaler Generic drug: albuterol SMARTSIG:1 Puff(s) Via Inhaler Every 4 Hours PRN   BIOTIN PO Take by mouth.   cetirizine 10 MG tablet Commonly known as: ZYRTEC Take 10 mg by mouth daily.   cyclobenzaprine 10 MG tablet Commonly known as: FLEXERIL 1 tablet as needed   HYDROcodone-acetaminophen 10-325 MG tablet Commonly known as: [place] SMARTSIG:1 Tablet(s) By Mouth Every 8-12 Hours PRN   IBUPROFEN PO Take by mouth.   ipratropium-albuterol 0.5-2.5 (3) MG/3ML Soln Commonly known as: DUONEB USE 1 AMPULE IN NEBULIZER EVERY 6 HOURS   lidocaine 5 % Commonly known as: LIDODERM SMARTSIG:3 Patch(s) Topical Every 12 Hours   lisinopril-hydrochlorothiazide 20-25 MG tablet Commonly known as: ZESTORETIC Take 0.5 tablets by mouth daily.   metFORMIN 500 MG 24 hr tablet Commonly known as: GLUCOPHAGE-XR SMARTSIG:1 Tablet(s) By Mouth Every Evening   montelukast 10 MG tablet Commonly known as: SINGULAIR Take 1 tablet by mouth daily.   nystatin 100000 UNIT/ML suspension Commonly known as: MYCOSTATIN 5 mls swish and swallow after Breztri use 2 times per day   omeprazole 40 MG capsule Commonly known as: PRILOSEC Take one capsule twice daily as directed.   ondansetron 8 MG tablet Commonly known as: ZOFRAN Take 8 mg by mouth 2 (two) times daily.   PARoxetine 40 MG tablet Commonly known as: PAXIL Take  40 mg by mouth daily.   Pharmacist Choice Autocode Sys w/Device Kit check blood sugar QD   PROBIOTIC PO Take by mouth.   Spiriva Respimat 1.25 MCG/ACT Aers Generic drug: Tiotropium Bromide Monohydrate Use two inhalations once daily as directed.   Symbicort 160-4.5 MCG/ACT inhaler Generic drug: budesonide-formoterol Inhale two puffs using spacer twice daily to prevent cough or wheeze.  Rinse, gargle, and spit after use.   TYLENOL PO Take by  mouth.   Vitamin D (Ergocalciferol) 1.25 MG (50000 UNIT) Caps capsule Commonly known as: DRISDOL Take 2 capsules by mouth once a week.     Past Medical History:  Diagnosis Date   Anxiety    Lyme disease    [place] spotted fever    Seizures ([place])    Syncope     Past Surgical History:  Procedure Laterality Date   CHOLECYSTECTOMY      Review of systems negative except as noted in HPI / PMHx or noted below:  Review of Systems  Constitutional: Negative.   HENT: Negative.    Eyes: Negative.   Respiratory: Negative.    Cardiovascular: Negative.   Gastrointestinal: Negative.   Genitourinary: Negative.   Musculoskeletal: Negative.   Skin: Negative.   Neurological: Negative.   Endo/Heme/Allergies: Negative.   Psychiatric/Behavioral: Negative.      Objective:   Vitals:   10/24/20 1656  BP: 98/80  Pulse: 92  Resp: 16  SpO2: 95%          Physical Exam Constitutional:      Appearance: She is not diaphoretic.  HENT:     Head: Normocephalic.     Right Ear: Tympanic membrane, ear canal and external ear normal.     Left Ear: Tympanic membrane, ear canal and external ear normal.     Nose: Nose normal. No mucosal edema or rhinorrhea.     Mouth/Throat:     Pharynx: Uvula midline. No oropharyngeal exudate.  Eyes:     Conjunctiva/sclera: Conjunctivae normal.  Neck:     Thyroid: No thyromegaly.     Trachea: Trachea normal. No tracheal tenderness or tracheal deviation.  Cardiovascular:     Rate and Rhythm: Normal rate and regular rhythm.     Heart sounds: Normal heart sounds, S1 normal and S2 normal. No murmur heard. Pulmonary:     Effort: No respiratory distress.     Breath sounds: Normal breath sounds. No stridor. No wheezing or rales.  Lymphadenopathy:     Head:     Right side of head: No tonsillar adenopathy.     Left side of head: No tonsillar adenopathy.     Cervical: No cervical adenopathy.  Skin:    Findings: No erythema or rash.     Nails: There  is no clubbing.  Neurological:     Mental Status: She is alert.    Diagnostics:    Spirometry was performed and demonstrated an FEV1 of 1.68 at 57 % of predicted.  Assessment and Plan:   1. COPD with asthma ([place])   2. Light tobacco smoker <10 cigarettes per day   3. Perennial allergic rhinitis   4. Allergic urticaria   5. Tinea versicolor   6. LPRD (laryngopharyngeal reflux disease)   7. Other eosinophilia     1.  Allergen avoidance measures - Dust mite, pollen, second hand smoke exposure  2.  Treat and prevent inflammation of airway:   A. START Symbicort 160 - 2 inhalations 2 times per day w/ spacer (empty lungs)  B. START Spiriva 1.25 Respimat - 2 inhalations 1 time per day  C. START Tezepelumab injections today and every 4 weeks  B. NASACORT - 1 spray each nostril few times per week  D. Montelukast 10 mg - 1 tablet 1 time per day  3.  Treat and prevent thrush:   A. Nystatin - 5 mls swish and swallow after Symbicort use    4.  Treat and prevent reflux:   A. Omeprezole 40 mg - 1 tablet 2 times per day  B. START Famotidine 40 mg - 1 tablet in evening  5.  Treat overactive immune system:   A. Cetirizine 10 mg - 1-2 tablets 1-2 times per day (MAX=40 mg/day)  6. If needed:   A. Albuterol HFA - 2 inhalations every 4-6 hours  7.  Return to clinic in 12 weeks or earlier if problem  8. Obtain fall flu vaccine  Briggette still continues to have evidence of significant inflammation of her respiratory tract and we will now start her on an anti-TSLP antibody and see if we can give her some anti-inflammatory agent medications that she can tolerate for both her upper and lower airway.  As well, her reflux is still out of control and we will have her utilize the addition of famotidine to her omeprazole.  We will see her back in his clinic in 12 weeks or earlier if there is a problem.  Allena Katz, MD Allergy / Immunology [place]

## 2020-10-28 ENCOUNTER — Encounter: Payer: Self-pay | Admitting: Allergy and Immunology

## 2020-10-28 ENCOUNTER — Ambulatory Visit (INDEPENDENT_AMBULATORY_CARE_PROVIDER_SITE_OTHER): Payer: Medicaid Other | Admitting: *Deleted

## 2020-10-28 ENCOUNTER — Other Ambulatory Visit: Payer: Self-pay

## 2020-10-28 DIAGNOSIS — J4550 Severe persistent asthma, uncomplicated: Secondary | ICD-10-CM

## 2020-10-28 MED ORDER — TEZEPELUMAB-EKKO 210 MG/1.91ML ~~LOC~~ SOSY
210.0000 mg | PREFILLED_SYRINGE | SUBCUTANEOUS | Status: AC
  Administered 2020-10-28 – 2024-01-03 (×20): 210 mg via SUBCUTANEOUS

## 2020-10-28 NOTE — Progress Notes (Signed)
Veronica Roy started [place] today, will continue every 4 weeks. All information reviewed and consent signed. Patient tolerated injection well with no issues after observed weight time.

## 2020-10-29 ENCOUNTER — Telehealth: Payer: Self-pay | Admitting: *Deleted

## 2020-10-29 NOTE — Telephone Encounter (Signed)
Spoke to patient and advised approval and submit for Tezspire to [facility].

## 2020-10-29 NOTE — Telephone Encounter (Signed)
Tried to reach patient to advise approval and submit for Tezspire but patient voicemail full

## 2020-10-29 NOTE — Telephone Encounter (Signed)
-----   Message from Anibal Henderson, [place] sent at 10/29/2020  8:30 AM EDT ----- Cephus Shelling,  Patient Veronica Roy started sample of Tezspire yesterday.  She has signed the Consent for Submission form and the Fast Start Form.  Please help.  Thank you :-) Dahlia Client

## 2020-11-25 ENCOUNTER — Ambulatory Visit: Payer: Medicaid Other

## 2020-11-26 ENCOUNTER — Ambulatory Visit (INDEPENDENT_AMBULATORY_CARE_PROVIDER_SITE_OTHER): Payer: Medicaid Other | Admitting: *Deleted

## 2020-11-26 ENCOUNTER — Other Ambulatory Visit: Payer: Self-pay

## 2020-11-26 DIAGNOSIS — J4550 Severe persistent asthma, uncomplicated: Secondary | ICD-10-CM | POA: Diagnosis not present

## 2020-12-24 ENCOUNTER — Ambulatory Visit: Payer: Medicaid Other

## 2020-12-30 ENCOUNTER — Ambulatory Visit (INDEPENDENT_AMBULATORY_CARE_PROVIDER_SITE_OTHER): Payer: Medicaid Other

## 2020-12-30 ENCOUNTER — Other Ambulatory Visit: Payer: Self-pay

## 2020-12-30 DIAGNOSIS — J4550 Severe persistent asthma, uncomplicated: Secondary | ICD-10-CM

## 2020-12-30 MED ORDER — SPIRIVA RESPIMAT 1.25 MCG/ACT IN AERS: INHALATION_SPRAY | RESPIRATORY_TRACT | 5 refills | Status: DC

## 2021-01-09 ENCOUNTER — Other Ambulatory Visit: Payer: Self-pay | Admitting: Allergy and Immunology

## 2021-01-09 ENCOUNTER — Other Ambulatory Visit: Payer: Self-pay

## 2021-01-09 MED ORDER — CETIRIZINE HCL 10 MG PO TABS: ORAL_TABLET | ORAL | 5 refills | Status: DC

## 2021-01-16 ENCOUNTER — Other Ambulatory Visit: Payer: Self-pay

## 2021-01-16 ENCOUNTER — Ambulatory Visit: Payer: Medicaid Other | Admitting: Allergy and Immunology

## 2021-01-16 VITALS — BP 112/74 | HR 94 | Resp 16

## 2021-01-16 DIAGNOSIS — L500 Allergic urticaria: Secondary | ICD-10-CM

## 2021-01-16 DIAGNOSIS — K219 Gastro-esophageal reflux disease without esophagitis: Secondary | ICD-10-CM

## 2021-01-16 DIAGNOSIS — J4550 Severe persistent asthma, uncomplicated: Secondary | ICD-10-CM | POA: Diagnosis not present

## 2021-01-16 DIAGNOSIS — B370 Candidal stomatitis: Secondary | ICD-10-CM

## 2021-01-16 DIAGNOSIS — F17210 Nicotine dependence, cigarettes, uncomplicated: Secondary | ICD-10-CM

## 2021-01-16 DIAGNOSIS — J3089 Other allergic rhinitis: Secondary | ICD-10-CM | POA: Diagnosis not present

## 2021-01-16 MED ORDER — NYSTATIN 100000 UNIT/ML MT SUSP: OROMUCOSAL | 5 refills | Status: DC

## 2021-01-16 MED ORDER — ALBUTEROL SULFATE HFA 108 (90 BASE) MCG/ACT IN AERS: 2.0000 | INHALATION_SPRAY | RESPIRATORY_TRACT | 2 refills | Status: DC | PRN

## 2021-01-16 NOTE — Patient Instructions (Addendum)
  1.  Allergen avoidance measures - Dust mite, pollen, smoke exposure  2.  Use nicotine substitutes to replace tobacco smoke exposure  3.  Treat and prevent inflammation of airway:   A. Symbicort 160 - 2 inhalations 2 times per day w/ spacer    B. Spiriva 1.25 Respimat - 2 inhalations 1 time per day  C. Tezepelumab injections every 4 weeks  D. Montelukast 10 mg - 1 tablet 1 time per day  4.  Treat and prevent thrush:   A. Nystatin - 5 mls swish and swallow after Symbicort use    5.  Treat and prevent reflux:   A. Omeprezole 40 mg - 1 tablet 2 times per day  B. Famotidine 40 mg - 1 tablet in evening  C. Evaluation with gastroenterologist for reflux and vomiting   6. If needed:   A. Albuterol HFA - 2 inhalations every 4-6 hours  B. Cetirizine 10 mg - 1-2 tablets 1-2 times per day (MAX=40 mg/day)  7.  Return to clinic in 12 weeks or earlier if problem

## 2021-01-16 NOTE — Progress Notes (Signed)
[facility] [place] [facility] [place]   Follow-up Note  Referring Provider: Bonnita Nasuti, MD Primary Provider: Bonnita Nasuti, MD Date of Office Visit: 01/16/2021  Subjective:   Veronica Roy (DOB: June 14, 1975) is a 45 y.o. female who returns to the [facility] [place] on 01/16/2021 in re-evaluation of the following:  HPI: Josy returns to this clinic in evaluation of asthma with component of COPD, allergic rhinitis, urticaria, LPR, history of thrush, history of eosinophilia, and tobacco smoke exposure.  Her last visit to this clinic was 24 October 2020.  Overall her airway is better but better for [place] means that she has chronic coughing and wheezing although the frequency of the symptoms are less than they were since she started her anti-TSLP antibody.  She still uses a bronchodilator but she can now go days without using this agent.  She still uses it on average about 1 time per day especially if she exerts herself.  She still continues to smoke about 3 cigarettes a day.  Recently she was given a Z-Pak for some reason that I cannot really discern.  She has not started this medication to date.  For some reason she is not using Symbicort.  It is not obvious why she is not using Symbicort.  It may be an insurance issue but is not entirely clear why she is not using this agent.  She still has some nasal congestion although this has improved.  She still has headaches although the frequency and intensity of this issue has improved.  She was having headaches on a daily basis and now she has a headache about twice a week and it lasts only 3 to 4 hours which is of significant improvement.  She still has regurgitation and vomiting although at a decreased frequency.  She has hiccups and burping and she vomits about 1 time every 2 weeks.  She has not been having any hives.  Her thrush is under good control.  Allergies as of 01/16/2021       Reactions   Ativan  [lorazepam] Anaphylaxis   Codeine Anaphylaxis   Penicillins Anaphylaxis   Aspirin Other (See Comments)   Other reaction(s): nosebleeds Other reaction(s): nosebleeds   Cefdinir Other (See Comments)   Other reaction(s): diarrhea/vomiting Other reaction(s): diarrhea/vomiting   Chlorphen-pe-acetaminophen Other (See Comments)   Latex    Meloxicam Other (See Comments)   Other reaction(s): nosebleeds Other reaction(s): nosebleeds   Nsaids    Other reaction(s): nausea   Phenergan [promethazine Hcl] Other (See Comments)   agitation   Shellfish Allergy    Vitamin D Other (See Comments)        Medication List    Accu-Chek FastClix Lancets Misc USE 1 LANCET TO CHECK GLUCOSE ONCE DAILY   Accu-Chek Guide test strip Generic drug: glucose blood daily.   albuterol (2.5 MG/3ML) 0.083% nebulizer solution Commonly known as: PROVENTIL Inhale 2.5 mg into the lungs 3 (three) times daily.   ProAir HFA 108 (90 Base) MCG/ACT inhaler Generic drug: albuterol SMARTSIG:1 Puff(s) Via Inhaler Every 4 Hours PRN   BIOTIN PO Take by mouth.   cetirizine 10 MG tablet Commonly known as: ZYRTEC TAKE 1-2 TABLET 1-2 TIMES PER DAY   cyclobenzaprine 10 MG tablet Commonly known as: FLEXERIL 1 tablet as needed   famotidine 40 MG tablet Commonly known as: PEPCID Take 1 tablet (40 mg total) by mouth every evening.   HYDROcodone-acetaminophen 10-325 MG tablet Commonly known as: [place] SMARTSIG:1  Tablet(s) By Mouth Every 8-12 Hours PRN   IBUPROFEN PO Take by mouth.   ipratropium-albuterol 0.5-2.5 (3) MG/3ML Soln Commonly known as: DUONEB USE 1 AMPULE IN NEBULIZER EVERY 6 HOURS   lidocaine 5 % Commonly known as: LIDODERM SMARTSIG:3 Patch(s) Topical Every 12 Hours   lisinopril-hydrochlorothiazide 20-25 MG tablet Commonly known as: ZESTORETIC Take 0.5 tablets by mouth daily.   metFORMIN 500 MG 24 hr tablet Commonly known as: GLUCOPHAGE-XR SMARTSIG:1 Tablet(s) By Mouth Every Evening    montelukast 10 MG tablet Commonly known as: SINGULAIR Take 1 tablet by mouth daily.   nystatin 100000 UNIT/ML suspension Commonly known as: MYCOSTATIN 5 mls swish and swallow after Breztri use 2 times per day   omeprazole 40 MG capsule Commonly known as: PRILOSEC Take one capsule twice daily as directed.   ondansetron 8 MG tablet Commonly known as: ZOFRAN Take 8 mg by mouth 2 (two) times daily.   PARoxetine 40 MG tablet Commonly known as: PAXIL Take 40 mg by mouth daily.   Pharmacist Choice Autocode Sys w/Device Kit check blood sugar QD   PROBIOTIC PO Take by mouth.   Spiriva Respimat 1.25 MCG/ACT Aers Generic drug: Tiotropium Bromide Monohydrate Use two inhalations once daily as directed.   Symbicort 160-4.5 MCG/ACT inhaler Generic drug: budesonide-formoterol Inhale two puffs using spacer twice daily to prevent cough or wheeze.  Rinse, gargle, and spit after use.   TYLENOL PO Take by mouth.   Vitamin D (Ergocalciferol) 1.25 MG (50000 UNIT) Caps capsule Commonly known as: DRISDOL Take 2 capsules by mouth once a week.    Past Medical History:  Diagnosis Date   Anxiety    Lyme disease    [place] spotted fever    Seizures ([place])    Syncope     Past Surgical History:  Procedure Laterality Date   CHOLECYSTECTOMY      Review of systems negative except as noted in HPI / PMHx or noted below:  Review of Systems  Constitutional: Negative.   HENT: Negative.    Eyes: Negative.   Respiratory: Negative.    Cardiovascular: Negative.   Gastrointestinal: Negative.   Genitourinary: Negative.   Musculoskeletal: Negative.   Skin: Negative.   Neurological: Negative.   Endo/Heme/Allergies: Negative.   Psychiatric/Behavioral: Negative.      Objective:   Vitals:   01/16/21 1039  BP: 112/74  Pulse: 94  Resp: 16  SpO2: 97%          Physical Exam Constitutional:      Appearance: She is not diaphoretic.  HENT:     Head: Normocephalic.     Right  Ear: Tympanic membrane, ear canal and external ear normal.     Left Ear: Tympanic membrane, ear canal and external ear normal.     Nose: Nose normal. No mucosal edema or rhinorrhea.     Mouth/Throat:     Pharynx: Uvula midline. No oropharyngeal exudate.  Eyes:     Conjunctiva/sclera: Conjunctivae normal.  Neck:     Thyroid: No thyromegaly.     Trachea: Trachea normal. No tracheal tenderness or tracheal deviation.  Cardiovascular:     Rate and Rhythm: Normal rate and regular rhythm.     Heart sounds: Normal heart sounds, S1 normal and S2 normal. No murmur heard. Pulmonary:     Effort: No respiratory distress.     Breath sounds: No stridor. Wheezing (Bilateral expiratory wheezes all lung fields) present. No rales.  Lymphadenopathy:     Head:     Right  side of head: No tonsillar adenopathy.     Left side of head: No tonsillar adenopathy.     Cervical: No cervical adenopathy.  Skin:    Findings: No erythema or rash.     Nails: There is no clubbing.  Neurological:     Mental Status: She is alert.    Diagnostics: none  Assessment and Plan:   1. Severe persistent asthma without complication   2. Not well controlled severe persistent asthma   3. Perennial allergic rhinitis   4. Allergic urticaria   5. LPRD (laryngopharyngeal reflux disease)   6. Light tobacco smoker <10 cigarettes per day   7. Thrush     1.  Allergen avoidance measures - Dust mite, pollen, smoke exposure  2.  Use nicotine substitutes to replace tobacco smoke exposure  3.  Treat and prevent inflammation of airway:   A. Symbicort 160 - 2 inhalations 2 times per day w/ spacer    B. Spiriva 1.25 Respimat - 2 inhalations 1 time per day  C. Tezepelumab injections every 4 weeks  D. Montelukast 10 mg - 1 tablet 1 time per day  4.  Treat and prevent thrush:   A. Nystatin - 5 mls swish and swallow after Symbicort use    5.  Treat and prevent reflux:   A. Omeprezole 40 mg - 1 tablet 2 times per day  B.  Famotidine 40 mg - 1 tablet in evening  C. Evaluation with gastroenterologist for reflux and vomiting   6. If needed:   A. Albuterol HFA - 2 inhalations every 4-6 hours  B. Cetirizine 10 mg - 1-2 tablets 1-2 times per day (MAX=40 mg/day)  7.  Return to clinic in 12 weeks or earlier if problem  Lonya needs to stop smoking, needs to consistently use her combination inhaler, needs to continue on other anti-inflammatory agents for airway including the use of anti-TSLP antibody, and needs to continue to treat reflux as noted above.  She will require evaluation with a gastroenterologist as she continues to vomit and have reflux even in the face of utilizing omeprazole and famotidine.  I will see her back in this clinic in 12 weeks or earlier if there is a problem.  Allena Katz, MD Allergy / Immunology [place]

## 2021-01-20 ENCOUNTER — Telehealth: Payer: Self-pay | Admitting: Allergy and Immunology

## 2021-01-20 ENCOUNTER — Encounter: Payer: Self-pay | Admitting: Allergy and Immunology

## 2021-01-20 MED ORDER — OMEPRAZOLE 40 MG PO CPDR: DELAYED_RELEASE_CAPSULE | ORAL | 5 refills | Status: DC

## 2021-01-20 NOTE — Telephone Encounter (Signed)
Refill for Omeprazole was sent in to [facility] in [place]

## 2021-01-20 NOTE — Telephone Encounter (Signed)
Patient is requesting a refill on Omeprazole. She states she contacted the pharmacy and they stated it was too early to refill. Patient is taking Omeprazole 2 times a day so she is running out sooner.   Best pharmacy- [facility] in [place]

## 2021-01-27 ENCOUNTER — Ambulatory Visit (INDEPENDENT_AMBULATORY_CARE_PROVIDER_SITE_OTHER): Payer: Medicaid Other | Admitting: *Deleted

## 2021-01-27 ENCOUNTER — Other Ambulatory Visit: Payer: Self-pay

## 2021-01-27 DIAGNOSIS — J4550 Severe persistent asthma, uncomplicated: Secondary | ICD-10-CM | POA: Diagnosis not present

## 2021-02-19 ENCOUNTER — Other Ambulatory Visit: Payer: Self-pay | Admitting: Allergy and Immunology

## 2021-02-24 ENCOUNTER — Ambulatory Visit: Payer: Medicaid Other

## 2021-02-26 ENCOUNTER — Ambulatory Visit (INDEPENDENT_AMBULATORY_CARE_PROVIDER_SITE_OTHER): Payer: Medicaid Other | Admitting: *Deleted

## 2021-02-26 ENCOUNTER — Other Ambulatory Visit: Payer: Self-pay

## 2021-02-26 DIAGNOSIS — J4550 Severe persistent asthma, uncomplicated: Secondary | ICD-10-CM

## 2021-02-28 ENCOUNTER — Other Ambulatory Visit: Payer: Self-pay | Admitting: *Deleted

## 2021-02-28 MED ORDER — VENTOLIN HFA 108 (90 BASE) MCG/ACT IN AERS: INHALATION_SPRAY | RESPIRATORY_TRACT | 1 refills | Status: DC

## 2021-03-26 ENCOUNTER — Other Ambulatory Visit: Payer: Self-pay

## 2021-03-26 ENCOUNTER — Ambulatory Visit (INDEPENDENT_AMBULATORY_CARE_PROVIDER_SITE_OTHER): Payer: Medicaid Other | Admitting: *Deleted

## 2021-03-26 DIAGNOSIS — J4550 Severe persistent asthma, uncomplicated: Secondary | ICD-10-CM | POA: Diagnosis not present

## 2021-04-10 ENCOUNTER — Ambulatory Visit: Payer: Medicaid Other | Admitting: Allergy and Immunology

## 2021-04-10 ENCOUNTER — Encounter: Payer: Self-pay | Admitting: Allergy and Immunology

## 2021-04-10 ENCOUNTER — Other Ambulatory Visit: Payer: Self-pay

## 2021-04-10 VITALS — BP 118/76 | HR 88 | Resp 16

## 2021-04-10 DIAGNOSIS — J3089 Other allergic rhinitis: Secondary | ICD-10-CM

## 2021-04-10 DIAGNOSIS — B370 Candidal stomatitis: Secondary | ICD-10-CM

## 2021-04-10 DIAGNOSIS — K219 Gastro-esophageal reflux disease without esophagitis: Secondary | ICD-10-CM

## 2021-04-10 DIAGNOSIS — L500 Allergic urticaria: Secondary | ICD-10-CM | POA: Diagnosis not present

## 2021-04-10 DIAGNOSIS — J449 Chronic obstructive pulmonary disease, unspecified: Secondary | ICD-10-CM | POA: Diagnosis not present

## 2021-04-10 MED ORDER — SPIRIVA RESPIMAT 1.25 MCG/ACT IN AERS: INHALATION_SPRAY | RESPIRATORY_TRACT | 11 refills | Status: DC

## 2021-04-10 NOTE — Progress Notes (Signed)
[facility] [place]   Follow-up Note  Referring Provider: Bonnita Nasuti, MD Primary Provider: Bonnita Nasuti, MD Date of Office Visit: 04/10/2021  Subjective:   Veronica Roy (DOB: 1975/05/21) is a 45 y.o. 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  I last saw in this clinic on 16 January 2021.  She is now living in the [place] area.  She believes that her airway continues to do well while using an anti-TSLP antibody.  She has much less coughing and wheezing.  She continues on a large collection of anti-inflammatory agents for her airway.  Her requirement for a bronchodilator is daily.  Fortunately, she has eliminated all tobacco smoke exposure and is now using a vape.  She does not have the much problems with her nose at this point in time.  She has not been having any problems with thrush while using nystatin after her Symbicort.  She has not been having any hives.  She continues to have intermittent vomiting averaging out to every week to every 2 weeks and she has lots of hiccuping and burping even in the face of aggressively treating her reflux disease.  She has not seen a gastroenterologist yet.  She took a home pregnancy test and apparently it might of been positive and she went to see her doctor this morning and apparently her urine pregnancy test was negative and she is getting a confirmation with the blood test today.  Allergies as of 04/10/2021       Reactions   Ativan [lorazepam] Anaphylaxis   Codeine Anaphylaxis   Penicillins Anaphylaxis   Aspirin Other (See Comments)   Other reaction(s): nosebleeds Other reaction(s): nosebleeds   Cefdinir Other (See Comments)   Other reaction(s): diarrhea/vomiting Other  reaction(s): diarrhea/vomiting   Chlorphen-pe-acetaminophen Other (See Comments)   Latex    Meloxicam Other (See Comments)   Other reaction(s): nosebleeds Other reaction(s): nosebleeds   Nsaids    Other reaction(s): nausea   Phenergan [promethazine Hcl] Other (See Comments)   agitation   Shellfish Allergy    Vitamin D Other (See Comments)        Medication List    Accu-Chek FastClix Lancets Misc USE 1 LANCET TO CHECK GLUCOSE ONCE DAILY   Accu-Chek Guide test strip Generic drug: glucose blood daily.   albuterol (2.5 MG/3ML) 0.083% nebulizer solution Commonly known as: PROVENTIL Inhale 2.5 mg into the lungs 3 (three) times daily.   Ventolin HFA 108 (90 Base) MCG/ACT inhaler Generic drug: albuterol Inhale two puffs every 4-6 hours if needed for cough or wheeze.   BIOTIN PO Take by mouth.   cetirizine 10 MG tablet Commonly known as: ZYRTEC TAKE 1-2 TABLET 1-2 TIMES PER DAY   cyclobenzaprine 10 MG tablet Commonly known as: FLEXERIL 1 tablet as needed   famotidine 40 MG tablet Commonly known as: PEPCID Take 1 tablet (40 mg total) by mouth every evening.   HYDROcodone-acetaminophen 10-325 MG tablet Commonly known as: NORCO SMARTSIG:1 Tablet(s) By Mouth Every 8-12 Hours PRN   IBUPROFEN PO Take by mouth.   ipratropium-albuterol 0.5-2.5 (3) MG/3ML Soln Commonly known as: DUONEB USE 1 AMPULE IN NEBULIZER EVERY 6 HOURS   lidocaine 5 % Commonly known as: LIDODERM SMARTSIG:3 Patch(s) Topical Every 12 Hours   lisinopril-hydrochlorothiazide 20-25  MG tablet Commonly known as: ZESTORETIC Take 0.5 tablets by mouth daily.   metFORMIN 500 MG 24 hr tablet Commonly known as: GLUCOPHAGE-XR SMARTSIG:1 Tablet(s) By Mouth Every Evening   montelukast 10 MG tablet Commonly known as: SINGULAIR Take 1 tablet by mouth daily.   nystatin 100000 UNIT/ML suspension Commonly known as: MYCOSTATIN 5 mls swish and swallow after Breztri use 2 times per day   omeprazole 40 MG  capsule Commonly known as: PRILOSEC Take one capsule twice daily as directed.   ondansetron 8 MG tablet Commonly known as: ZOFRAN Take 8 mg by mouth 2 (two) times daily.   PARoxetine 40 MG tablet Commonly known as: PAXIL Take 40 mg by mouth daily.   Pharmacist Choice Autocode Sys w/Device Kit check blood sugar QD   PROBIOTIC PO Take by mouth.   Spiriva Respimat 1.25 MCG/ACT Aers Generic drug: Tiotropium Bromide Monohydrate Use two inhalations once daily as directed.   Symbicort 160-4.5 MCG/ACT inhaler Generic drug: budesonide-formoterol Inhale two puffs using spacer twice daily to prevent cough or wheeze.  Rinse, gargle, and spit after use.   TYLENOL PO Take by mouth.   Vitamin D (Ergocalciferol) 1.25 MG (50000 UNIT) Caps capsule Commonly known as: DRISDOL Take 2 capsules by mouth once a week.    Past Medical History:  Diagnosis Date   Anxiety    Lyme disease    [place] spotted fever    Seizures ([place])    Syncope     Past Surgical History:  Procedure Laterality Date   CHOLECYSTECTOMY      Review of systems negative except as noted in HPI / PMHx or noted below:  Review of Systems  Constitutional: Negative.   HENT: Negative.    Eyes: Negative.   Respiratory: Negative.    Cardiovascular: Negative.   Gastrointestinal: Negative.   Genitourinary: Negative.   Musculoskeletal: Negative.   Skin: Negative.   Neurological: Negative.   Endo/Heme/Allergies: Negative.   Psychiatric/Behavioral: Negative.      Objective:   Vitals:   04/10/21 1102  BP: 118/76  Pulse: 88  Resp: 16  SpO2: 99%          Physical Exam Constitutional:      Appearance: She is not diaphoretic.  HENT:     Head: Normocephalic.     Right Ear: Tympanic membrane, ear canal and external ear normal.     Left Ear: Tympanic membrane, ear canal and external ear normal.     Nose: Nose normal. No mucosal edema or rhinorrhea.     Mouth/Throat:     Pharynx: Uvula midline. No  oropharyngeal exudate.  Eyes:     Conjunctiva/sclera: Conjunctivae normal.  Neck:     Thyroid: No thyromegaly.     Trachea: Trachea normal. No tracheal tenderness or tracheal deviation.  Cardiovascular:     Rate and Rhythm: Normal rate and regular rhythm.     Heart sounds: Normal heart sounds, S1 normal and S2 normal. No murmur heard. Pulmonary:     Effort: No respiratory distress.     Breath sounds: No stridor. Wheezing (End expiratory wheezes posterior lung fields bilaterally) present. No rales.  Lymphadenopathy:     Head:     Right side of head: No tonsillar adenopathy.     Left side of head: No tonsillar adenopathy.     Cervical: No cervical adenopathy.  Skin:    Findings: No erythema or rash.     Nails: There is no clubbing.  Neurological:     Mental  Status: She is alert.    Diagnostics:    Spirometry was performed and demonstrated an FEV1 of 1.20 at 41 % of predicted.  Assessment and Plan:   1. COPD with asthma ([place])   2. Perennial allergic rhinitis   3. Allergic urticaria   4. LPRD (laryngopharyngeal reflux disease)   5. Thrush     1.  Allergen avoidance measures - Dust mite, pollen, smoke exposure  2.  Continue to remain away from smoke exposure  3.  Continue to treat and prevent inflammation of airway:   A. Symbicort 160 - 2 inhalations 2 times per day w/ spacer    B. Spiriva 1.25 Respimat - 2 inhalations 1 time per day  C. Tezepelumab injections every 4 weeks  4.  Continue to treat and prevent thrush:   A. Nystatin - 5 mls swish and swallow after Symbicort use    5.  Continue to treat and prevent reflux:   A. Omeprezole 40 mg - 1 tablet 2 times per day  B. Famotidine 40 mg - 1 tablet in evening  C. Evaluation with gastroenterologist for reflux and vomiting   6. If needed:   A. Albuterol HFA - 2 inhalations every 4-6 hours  B. Cetirizine 10 mg - 1-2 tablets 1-2 times per day (MAX=40 mg/day)  7.  Return to clinic in 12 months or earlier if  problem.  Eleshia is stable at this point in time on her current therapy which includes an anti-TSLP antibody, collection of anti-inflammatory agents for airway, and therapy directed against reflux.  She needs to see a gastroenterologist regarding her continued reflux issues and emesis.  She has moved to [place] and I informed her that we can continue to provide her medications as long she is doing okay for up to a year legally.  I will need to see her back in this clinic at that point in time if she would like for our clinic to continue to provide care for her regarding her respiratory tract issues.  Allena Katz, MD Allergy / Immunology [place]

## 2021-04-10 NOTE — Patient Instructions (Addendum)
°  1.  Allergen avoidance measures - Dust mite, pollen, smoke exposure  2.  Continue to remain away from smoke exposure  3.  Continue to treat and prevent inflammation of airway:   A. Symbicort 160 - 2 inhalations 2 times per day w/ spacer    B. Spiriva 1.25 Respimat - 2 inhalations 1 time per day  C. Tezepelumab injections every 4 weeks  4.  Continue to treat and prevent thrush:   A. Nystatin - 5 mls swish and swallow after Symbicort use    5.  Continue to treat and prevent reflux:   A. Omeprezole 40 mg - 1 tablet 2 times per day  B. Famotidine 40 mg - 1 tablet in evening  C. Evaluation with gastroenterologist for reflux and vomiting   6. If needed:   A. Albuterol HFA - 2 inhalations every 4-6 hours  B. Cetirizine 10 mg - 1-2 tablets 1-2 times per day (MAX=40 mg/day)  7.  Return to clinic in 12 months or earlier if problem.

## 2021-04-14 ENCOUNTER — Encounter: Payer: Self-pay | Admitting: Allergy and Immunology

## 2021-04-21 ENCOUNTER — Telehealth: Payer: Self-pay | Admitting: Allergy and Immunology

## 2021-04-21 MED ORDER — SPIRIVA RESPIMAT 1.25 MCG/ACT IN AERS: INHALATION_SPRAY | RESPIRATORY_TRACT | 11 refills | Status: DC

## 2021-04-21 NOTE — Telephone Encounter (Signed)
Patient states she dropped her Spiriva and it broke. She is requesting to have another one sent, if possible.   Best pharmacy- [facility]

## 2021-04-21 NOTE — Telephone Encounter (Signed)
Veronica Roy has sent Spiriva to pharmacy. Patient informed.

## 2021-04-23 ENCOUNTER — Ambulatory Visit: Payer: Medicaid Other

## 2021-05-05 ENCOUNTER — Ambulatory Visit: Payer: Medicaid Other

## 2021-05-06 ENCOUNTER — Other Ambulatory Visit: Payer: Self-pay

## 2021-05-06 ENCOUNTER — Ambulatory Visit (INDEPENDENT_AMBULATORY_CARE_PROVIDER_SITE_OTHER): Payer: Medicaid Other | Admitting: *Deleted

## 2021-05-06 DIAGNOSIS — J4550 Severe persistent asthma, uncomplicated: Secondary | ICD-10-CM | POA: Diagnosis not present

## 2021-06-02 ENCOUNTER — Ambulatory Visit: Payer: Medicaid Other

## 2021-06-03 ENCOUNTER — Other Ambulatory Visit: Payer: Self-pay

## 2021-06-03 ENCOUNTER — Ambulatory Visit (INDEPENDENT_AMBULATORY_CARE_PROVIDER_SITE_OTHER): Payer: Medicaid Other | Admitting: *Deleted

## 2021-06-03 ENCOUNTER — Ambulatory Visit: Payer: Medicaid Other

## 2021-06-03 DIAGNOSIS — J4550 Severe persistent asthma, uncomplicated: Secondary | ICD-10-CM

## 2021-07-01 ENCOUNTER — Other Ambulatory Visit: Payer: Self-pay

## 2021-07-01 ENCOUNTER — Ambulatory Visit (INDEPENDENT_AMBULATORY_CARE_PROVIDER_SITE_OTHER): Payer: Medicaid Other | Admitting: *Deleted

## 2021-07-01 DIAGNOSIS — J4550 Severe persistent asthma, uncomplicated: Secondary | ICD-10-CM | POA: Diagnosis not present

## 2021-07-14 ENCOUNTER — Telehealth: Payer: Self-pay | Admitting: Allergy and Immunology

## 2021-07-14 MED ORDER — VENTOLIN HFA 108 (90 BASE) MCG/ACT IN AERS: INHALATION_SPRAY | RESPIRATORY_TRACT | 1 refills | Status: DC

## 2021-07-14 NOTE — Telephone Encounter (Signed)
ERX sent and patient informed.  

## 2021-07-14 NOTE — Telephone Encounter (Signed)
Patient is requesting a refill on her Ventolin inhaler.  ? ?Best pharmacy- ?[facility] in [place], [place]  ?

## 2021-07-29 ENCOUNTER — Ambulatory Visit: Payer: Medicaid Other

## 2021-08-04 ENCOUNTER — Ambulatory Visit (INDEPENDENT_AMBULATORY_CARE_PROVIDER_SITE_OTHER): Payer: Medicaid Other | Admitting: *Deleted

## 2021-08-04 DIAGNOSIS — J4550 Severe persistent asthma, uncomplicated: Secondary | ICD-10-CM

## 2021-09-01 ENCOUNTER — Ambulatory Visit (INDEPENDENT_AMBULATORY_CARE_PROVIDER_SITE_OTHER): Payer: Medicaid Other

## 2021-09-01 DIAGNOSIS — J4550 Severe persistent asthma, uncomplicated: Secondary | ICD-10-CM

## 2021-09-03 ENCOUNTER — Other Ambulatory Visit: Payer: Self-pay | Admitting: *Deleted

## 2021-09-03 MED ORDER — VENTOLIN HFA 108 (90 BASE) MCG/ACT IN AERS: INHALATION_SPRAY | RESPIRATORY_TRACT | 1 refills | Status: DC

## 2021-09-29 ENCOUNTER — Ambulatory Visit (INDEPENDENT_AMBULATORY_CARE_PROVIDER_SITE_OTHER): Payer: Medicaid Other | Admitting: *Deleted

## 2021-09-29 DIAGNOSIS — J4550 Severe persistent asthma, uncomplicated: Secondary | ICD-10-CM | POA: Diagnosis not present

## 2021-10-27 ENCOUNTER — Ambulatory Visit: Payer: Medicaid Other

## 2021-10-28 ENCOUNTER — Other Ambulatory Visit: Payer: Self-pay | Admitting: *Deleted

## 2021-10-28 MED ORDER — TEZSPIRE 210 MG/1.91ML ~~LOC~~ SOSY: 210.0000 mg | PREFILLED_SYRINGE | SUBCUTANEOUS | 11 refills | Status: DC

## 2021-10-29 ENCOUNTER — Ambulatory Visit (INDEPENDENT_AMBULATORY_CARE_PROVIDER_SITE_OTHER): Payer: Medicaid Other | Admitting: *Deleted

## 2021-10-29 DIAGNOSIS — J4550 Severe persistent asthma, uncomplicated: Secondary | ICD-10-CM | POA: Diagnosis not present

## 2021-11-26 ENCOUNTER — Ambulatory Visit: Payer: Medicaid Other

## 2021-11-27 ENCOUNTER — Ambulatory Visit (INDEPENDENT_AMBULATORY_CARE_PROVIDER_SITE_OTHER): Payer: Medicaid Other | Admitting: *Deleted

## 2021-11-27 DIAGNOSIS — J4550 Severe persistent asthma, uncomplicated: Secondary | ICD-10-CM | POA: Diagnosis not present

## 2021-12-25 ENCOUNTER — Ambulatory Visit (INDEPENDENT_AMBULATORY_CARE_PROVIDER_SITE_OTHER): Payer: Medicaid Other | Admitting: *Deleted

## 2021-12-25 DIAGNOSIS — J4550 Severe persistent asthma, uncomplicated: Secondary | ICD-10-CM | POA: Diagnosis not present

## 2022-01-14 ENCOUNTER — Telehealth: Payer: Self-pay | Admitting: Allergy and Immunology

## 2022-01-14 NOTE — Telephone Encounter (Signed)
Patient states she has been struggling with her breathing the past 2 weeks. She is taking her allergy medications and injection as directed. She has been taking Mucinex every 4-5 hours for the past 2 weeks. When she has this kind of flare it usually only lasts 5-6 days but she is concerned since its been longer. She has a wet cough, a lot of sinus drainage, itchy nose and eyes. She mentioned she is prone to sinus infections and bronchitis.

## 2022-01-14 NOTE — Telephone Encounter (Signed)
Talked with patient.  She cannot come in the morning but will come at 3:00 pm tomorrow.

## 2022-01-14 NOTE — Telephone Encounter (Signed)
Please advise 

## 2022-01-15 ENCOUNTER — Encounter: Payer: Self-pay | Admitting: Allergy and Immunology

## 2022-01-15 ENCOUNTER — Ambulatory Visit: Payer: Medicaid Other | Admitting: Allergy and Immunology

## 2022-01-15 VITALS — BP 110/64 | HR 92 | Resp 16 | Ht 66.0 in | Wt 208.4 lb

## 2022-01-15 DIAGNOSIS — B370 Candidal stomatitis: Secondary | ICD-10-CM | POA: Diagnosis not present

## 2022-01-15 DIAGNOSIS — J441 Chronic obstructive pulmonary disease with (acute) exacerbation: Secondary | ICD-10-CM

## 2022-01-15 DIAGNOSIS — F17210 Nicotine dependence, cigarettes, uncomplicated: Secondary | ICD-10-CM

## 2022-01-15 DIAGNOSIS — J3089 Other allergic rhinitis: Secondary | ICD-10-CM | POA: Diagnosis not present

## 2022-01-15 DIAGNOSIS — D7219 Other eosinophilia: Secondary | ICD-10-CM

## 2022-01-15 DIAGNOSIS — J4489 Other specified chronic obstructive pulmonary disease: Secondary | ICD-10-CM

## 2022-01-15 DIAGNOSIS — K219 Gastro-esophageal reflux disease without esophagitis: Secondary | ICD-10-CM

## 2022-01-15 DIAGNOSIS — J45901 Unspecified asthma with (acute) exacerbation: Secondary | ICD-10-CM

## 2022-01-15 MED ORDER — METHYLPREDNISOLONE ACETATE 80 MG/ML IJ SUSP
80.0000 mg | Freq: Once | INTRAMUSCULAR | Status: AC
  Administered 2022-01-15: 80 mg via INTRAMUSCULAR

## 2022-01-15 MED ORDER — OMEPRAZOLE 40 MG PO CPDR: DELAYED_RELEASE_CAPSULE | ORAL | 11 refills | Status: DC

## 2022-01-15 MED ORDER — BREZTRI AEROSPHERE 160-9-4.8 MCG/ACT IN AERO: INHALATION_SPRAY | RESPIRATORY_TRACT | 11 refills | Status: DC

## 2022-01-15 MED ORDER — NYSTATIN 100000 UNIT/ML MT SUSP: OROMUCOSAL | 11 refills | Status: DC

## 2022-01-15 MED ORDER — CLINDAMYCIN HCL 150 MG PO CAPS: ORAL_CAPSULE | ORAL | 0 refills | Status: DC

## 2022-01-15 NOTE — Patient Instructions (Addendum)
  1.  Allergen avoidance measures - Dust mite, pollen, smoke exposure  2.  Continue to remain away from smoke exposure as much as possible  3.  Continue to treat and prevent inflammation of airway:   A.  Breztri - 2 inhalations twice a day with spacer  B. Tezepelumab injections every 4 weeks  4.  Continue to treat and prevent thrush:   A. Nystatin - 5 mls swish and swallow after Breztri use    5.  Continue to treat and prevent reflux:   A. Omeprezole 40 mg - 1 tablet 2 times per day  B. Famotidine 40 mg - 1 tablet in evening   6. If needed:   A. Albuterol HFA - 2 inhalations every 4-6 hours  B. Cetirizine 10 mg - 1-2 tablets 1-2 times per day (MAX=40 mg/day)  7. For this recent event:   A.  Clindamycin 150 mg -1 tablet 3 times per day for 10 days  B.  Depo-Medrol 80 IM delivered in clinic today  8.  Return to clinic in 12 months or earlier if problem.  9. Obtain fall flu vaccine

## 2022-01-15 NOTE — Progress Notes (Signed)
[facility] [place] [facility] [place]   Follow-up Note  Referring Provider: Bonnita Nasuti, MD Primary Provider: Bonnita Nasuti, MD Date of Office Visit: 01/15/2022  Subjective:   Veronica Roy (DOB: 12-09-1975) is a 46 y.o. female who returns to the [facility] on 01/15/2022 in re-evaluation of the following:  HPI: Kamika presents to this clinic in evaluation of COPD / asthma overlap, tobacco smoking, rhinitis, LPR, thrush, and history of urticaria and eosinophilia.  I last saw her in this clinic on 10 April 2021.  We have been treating her with anti-TSLP antibody which has really worked very well and preventing her from developing significant respiratory tract symptoms and it does not sound as though she has required a systemic steroid or an antibiotic for any issue with her airway and she rarely uses a short acting bronchodilator while she continues on this biologic agent as well as Spiriva.  Symbicort ended up giving her headache and she discontinued this agent.  Unfortunately, she got sick about 1 week ago with the development of nasal congestion and itchy red watery eyes and coughing and posttussive emesis.  She is not really getting better with her coughing and now she is making yellow sputum.  She did not have any chills or ugly nasal discharge or chest pain.  She does feel as though her ears are little bit clogged.  She continues to smoke about 3 cigarettes/day and vapes at other points of the day.  She continues to have problems with recurrent thrush when she runs out of her nystatin's oral solution.  If she uses her oral solution she actually does pretty well with her thrush.  She believes that her reflux is under good control at this point in time on her current plan.  She did see gastroenterologist and she is scheduled to have a colonoscopy and upper endoscopy sometime in the next month.  Allergies as of 01/15/2022       Reactions    Ativan [lorazepam] Anaphylaxis   Codeine Anaphylaxis   Penicillins Anaphylaxis   Aspirin Other (See Comments)   Other reaction(s): nosebleeds Other reaction(s): nosebleeds   Cefdinir Other (See Comments)   Other reaction(s): diarrhea/vomiting Other reaction(s): diarrhea/vomiting   Chlorphen-pe-acetaminophen Other (See Comments)   Latex    Meloxicam Other (See Comments)   Other reaction(s): nosebleeds Other reaction(s): nosebleeds   Nsaids    Other reaction(s): nausea   Phenergan [promethazine Hcl] Other (See Comments)   agitation   Shellfish Allergy    Vitamin D Other (See Comments)        Medication List    Accu-Chek FastClix Lancets Misc USE 1 LANCET TO CHECK GLUCOSE ONCE DAILY   Accu-Chek Guide test strip Generic drug: glucose blood daily.   albuterol (2.5 MG/3ML) 0.083% nebulizer solution Commonly known as: PROVENTIL Inhale 2.5 mg into the lungs 3 (three) times daily.   Ventolin HFA 108 (90 Base) MCG/ACT inhaler Generic drug: albuterol Inhale two puffs every 4-6 hours if needed for cough or wheeze.   busPIRone 10 MG tablet Commonly known as: BUSPAR Take 10 mg by mouth 2 (two) times daily.   cetirizine 10 MG tablet Commonly known as: ZYRTEC TAKE 1-2 TABLET 1-2 TIMES PER DAY   cyclobenzaprine 10 MG tablet Commonly known as: FLEXERIL 1 tablet as needed   famotidine 40 MG tablet Commonly known as: PEPCID Take 1 tablet (40 mg total) by mouth every evening.   gabapentin 300 MG capsule  Commonly known as: NEURONTIN Take by mouth.   HYDROcodone-acetaminophen 10-325 MG tablet Commonly known as: NORCO SMARTSIG:1 Tablet(s) By Mouth Every 8-12 Hours PRN   ipratropium-albuterol 0.5-2.5 (3) MG/3ML Soln Commonly known as: DUONEB USE 1 AMPULE IN NEBULIZER EVERY 6 HOURS   lidocaine 5 % Commonly known as: LIDODERM SMARTSIG:3 Patch(s) Topical Every 12 Hours   lisinopril-hydrochlorothiazide 20-25 MG tablet Commonly known as: ZESTORETIC Take 0.5 tablets by  mouth daily.   montelukast 10 MG tablet Commonly known as: SINGULAIR Take 1 tablet by mouth daily.   nystatin 100000 UNIT/ML suspension Commonly known as: MYCOSTATIN 5 mls swish and swallow after Breztri use 2 times per day   omeprazole 40 MG capsule Commonly known as: PRILOSEC Take one capsule twice daily as directed.   ondansetron 8 MG tablet Commonly known as: ZOFRAN Take 8 mg by mouth 2 (two) times daily.   Pharmacist Choice Autocode Sys w/Device Kit check blood sugar QD   Spiriva Respimat 1.25 MCG/ACT Aers Generic drug: Tiotropium Bromide Monohydrate Use two inhalations once daily as directed.   Symbicort 160-4.5 MCG/ACT inhaler Generic drug: budesonide-formoterol Inhale two puffs using spacer twice daily to prevent cough or wheeze.  Rinse, gargle, and spit after use.   Tezspire 210 MG/1.91ML syringe Generic drug: tezepelumab-ekko Inject 1.91 mLs (210 mg total) into the skin every 28 (twenty-eight) days.   TYLENOL PO Take by mouth.   Vilazodone HCl 40 MG Tabs Commonly known as: VIIBRYD Take 40 mg by mouth daily.   Vitamin D (Ergocalciferol) 1.25 MG (50000 UNIT) Caps capsule Commonly known as: DRISDOL Take 2 capsules by mouth once a week.    Past Medical History:  Diagnosis Date   Anxiety    Lyme disease    [place] spotted fever    Seizures ([place])    Syncope     Past Surgical History:  Procedure Laterality Date   CHOLECYSTECTOMY      Review of systems negative except as noted in HPI / PMHx or noted below:  Review of Systems  Constitutional: Negative.   HENT: Negative.    Eyes: Negative.   Respiratory: Negative.    Cardiovascular: Negative.   Gastrointestinal: Negative.   Genitourinary: Negative.   Musculoskeletal: Negative.   Skin: Negative.   Neurological: Negative.   Endo/Heme/Allergies: Negative.   Psychiatric/Behavioral: Negative.       Objective:   Vitals:   01/15/22 1501  BP: 110/64  Pulse: 92  Resp: 16  SpO2: 96%    Height: '5\' 6"'  (167.6 cm)  Weight: 208 lb 6.4 oz (94.5 kg)   Physical Exam Constitutional:      Appearance: She is not diaphoretic.  HENT:     Head: Normocephalic.     Right Ear: Tympanic membrane, ear canal and external ear normal.     Left Ear: Tympanic membrane, ear canal and external ear normal.     Nose: Nose normal. No mucosal edema or rhinorrhea.     Mouth/Throat:     Pharynx: Uvula midline. No oropharyngeal exudate.  Eyes:     Conjunctiva/sclera: Conjunctivae normal.  Neck:     Thyroid: No thyromegaly.     Trachea: Trachea normal. No tracheal tenderness or tracheal deviation.  Cardiovascular:     Rate and Rhythm: Normal rate and regular rhythm.     Heart sounds: Normal heart sounds, S1 normal and S2 normal. No murmur heard. Pulmonary:     Effort: No respiratory distress.     Breath sounds: Normal breath sounds. No stridor. No  wheezing or rales.  Lymphadenopathy:     Head:     Right side of head: No tonsillar adenopathy.     Left side of head: No tonsillar adenopathy.     Cervical: No cervical adenopathy.  Skin:    Findings: No erythema or rash.     Nails: There is no clubbing.  Neurological:     Mental Status: She is alert.     Diagnostics:    Spirometry was performed and demonstrated an FEV1 of 1.64 at 55 % of predicted.  Assessment and Plan:   1. Asthma with COPD with exacerbation ([place])   2. LPRD (laryngopharyngeal reflux disease)   3. Thrush   4. Light tobacco smoker <10 cigarettes per day   5. Perennial allergic rhinitis   6. Other eosinophilia    1.  Allergen avoidance measures - Dust mite, pollen, smoke exposure  2.  Continue to remain away from smoke exposure as much as possible  3.  Continue to treat and prevent inflammation of airway:   A.  Breztri - 2 inhalations twice a day with spacer  B. Tezepelumab injections every 4 weeks  4.  Continue to treat and prevent thrush:   A. Nystatin - 5 mls swish and swallow after Breztri use    5.   Continue to treat and prevent reflux:   A. Omeprezole 40 mg - 1 tablet 2 times per day  B. Famotidine 40 mg - 1 tablet in evening   6. If needed:   A. Albuterol HFA - 2 inhalations every 4-6 hours  B. Cetirizine 10 mg - 1-2 tablets 1-2 times per day (MAX=40 mg/day)  7. For this recent event:   A.  Clindamycin 150 mg -1 tablet 3 times per day for 10 days  B.  Depo-Medrol 80 IM delivered in clinic today  8.  Return to clinic in 12 months or earlier if problem.  9. Obtain fall flu vaccine  Meka appears to be acutely infected and I am going to give her a broad-spectrum antibiotic in the form of clindamycin given the fact that she is a smoker who is now making ugly sputum production.  Have also given her and her a systemic steroid as noted above to treat with the inflammatory component of this issue.  She is not using her Symbicort but is using Spiriva now we will switch her over to triple inhaler as noted above.  She has the option of using her nystatin to prevent her from developing problems with thrush.  She will continue to aggressively treat her reflux as noted above.  Assuming she does well with this plan I will see her back in this clinic in 1 year or earlier if there is a problem.  Allena Katz, MD Allergy / Immunology [place]

## 2022-01-18 ENCOUNTER — Encounter: Payer: Self-pay | Admitting: Allergy and Immunology

## 2022-01-19 ENCOUNTER — Telehealth: Payer: Self-pay | Admitting: Allergy and Immunology

## 2022-01-19 NOTE — Telephone Encounter (Signed)
Called but unable to leave voicemail.

## 2022-01-19 NOTE — Telephone Encounter (Signed)
Patient states she took clindamycin for 2 days and every time she took it she would throw up 30 minutes to an hour later. She has taken this medication before and had no issues but states it is not normal for her to vomit. She is taking food with the medication as well. She states she did not take the medication last night and had no issues.

## 2022-01-20 MED ORDER — AZITHROMYCIN 250 MG PO TABS: 250.0000 mg | ORAL_TABLET | Freq: Every day | ORAL | 0 refills | Status: AC

## 2022-01-20 NOTE — Telephone Encounter (Signed)
Patient informed and verbalized understanding. Prescription for azithromycin 250 mg sent to requested pharmacy.

## 2022-01-22 ENCOUNTER — Ambulatory Visit (INDEPENDENT_AMBULATORY_CARE_PROVIDER_SITE_OTHER): Payer: Medicaid Other | Admitting: *Deleted

## 2022-01-22 DIAGNOSIS — J4550 Severe persistent asthma, uncomplicated: Secondary | ICD-10-CM | POA: Diagnosis not present

## 2022-02-19 ENCOUNTER — Ambulatory Visit: Payer: Medicaid Other

## 2022-02-26 ENCOUNTER — Ambulatory Visit (INDEPENDENT_AMBULATORY_CARE_PROVIDER_SITE_OTHER): Payer: Medicaid Other | Admitting: *Deleted

## 2022-02-26 ENCOUNTER — Telehealth: Payer: Self-pay | Admitting: *Deleted

## 2022-02-26 DIAGNOSIS — J4550 Severe persistent asthma, uncomplicated: Secondary | ICD-10-CM | POA: Diagnosis not present

## 2022-02-26 NOTE — Telephone Encounter (Signed)
Veronica Roy states that she cannot use Breztri because it causes her to be extremely jittery. She has used Advair Diskus in the past and had very good results. She would like to switch back to this. She is coughing a lot and is "croupy" but she has not been using an inhaled steroid in quite a while. Please advise.   FYI: She did go to urgent care for her cough and was tested for covid, flu, strep, and all were negative. She also had a chest x-ray and it was normal.

## 2022-02-27 ENCOUNTER — Other Ambulatory Visit: Payer: Self-pay | Admitting: *Deleted

## 2022-02-27 MED ORDER — SPIRIVA RESPIMAT 1.25 MCG/ACT IN AERS: INHALATION_SPRAY | RESPIRATORY_TRACT | 5 refills | Status: AC

## 2022-02-27 MED ORDER — SYMBICORT 160-4.5 MCG/ACT IN AERO: INHALATION_SPRAY | RESPIRATORY_TRACT | 5 refills | Status: DC

## 2022-02-27 NOTE — Telephone Encounter (Signed)
Patient informed and rx sent. 

## 2022-03-26 ENCOUNTER — Ambulatory Visit: Payer: Medicaid Other

## 2022-03-30 ENCOUNTER — Ambulatory Visit (INDEPENDENT_AMBULATORY_CARE_PROVIDER_SITE_OTHER): Payer: Medicaid Other | Admitting: *Deleted

## 2022-03-30 DIAGNOSIS — J4550 Severe persistent asthma, uncomplicated: Secondary | ICD-10-CM

## 2022-04-27 ENCOUNTER — Ambulatory Visit: Payer: Medicaid Other

## 2022-04-28 ENCOUNTER — Ambulatory Visit: Payer: Medicaid Other

## 2022-05-06 ENCOUNTER — Telehealth: Payer: Self-pay | Admitting: Allergy and Immunology

## 2022-05-06 NOTE — Telephone Encounter (Signed)
Please advise 

## 2022-05-06 NOTE — Telephone Encounter (Signed)
Patient states she is having a hard time coming in for her injection since she lives in [place] and has no reliable transportation. She is feeling really bad since she is late on her Tezspire. She's wondering if Dr. Neldon Mc could refer her to an allergist in [place].

## 2022-05-07 NOTE — Telephone Encounter (Signed)
Please advise 

## 2022-05-12 MED ORDER — TEZSPIRE 210 MG/1.91ML ~~LOC~~ SOAJ: 210.0000 mg | SUBCUTANEOUS | 11 refills | Status: DC

## 2022-05-12 NOTE — Telephone Encounter (Signed)
Called patient and advised change to autoinjector and rx sent to [facility]. I advised patient of delivery, storage and in clinic admin instrux due to never self admin before

## 2022-05-25 ENCOUNTER — Other Ambulatory Visit: Payer: Self-pay

## 2022-05-25 MED ORDER — ALBUTEROL SULFATE (2.5 MG/3ML) 0.083% IN NEBU: 2.5000 mg | INHALATION_SOLUTION | Freq: Three times a day (TID) | RESPIRATORY_TRACT | 2 refills | Status: DC

## 2022-06-18 ENCOUNTER — Telehealth: Payer: Self-pay | Admitting: Allergy and Immunology

## 2022-06-18 MED ORDER — PREDNISONE 10 MG PO TABS: ORAL_TABLET | ORAL | 0 refills | Status: DC

## 2022-06-18 NOTE — Telephone Encounter (Signed)
Patient states she went to Urgent Care in [place] on Sunday and tested positive for Flu A & B. Urgent Care only prescribed her Zofran for nausea and Imodium for diarrhea. Her symptoms started a couple of days before Sunday with fatigue and her body felt sore. She is now dealing with congestion, coughing and throwing up green phlegm. Her breathing has been bad and she feels that she could be developing Bronchitis. She is using her inhaler every 4 hours and nebulizer every 6 hours. She is wanting to know if there is anything we can send in to help with her symptoms.

## 2022-06-18 NOTE — Telephone Encounter (Signed)
Patient informed of Dr. Bruna Potter message and [facility] sent.

## 2022-08-07 ENCOUNTER — Other Ambulatory Visit: Payer: Self-pay | Admitting: Allergy and Immunology

## 2022-08-07 ENCOUNTER — Telehealth: Payer: Self-pay | Admitting: Allergy and Immunology

## 2022-08-07 ENCOUNTER — Other Ambulatory Visit: Payer: Self-pay | Admitting: *Deleted

## 2022-08-07 MED ORDER — OMEPRAZOLE 40 MG PO CPDR: DELAYED_RELEASE_CAPSULE | ORAL | 5 refills | Status: DC

## 2022-08-07 NOTE — Telephone Encounter (Signed)
RX sent

## 2022-08-07 NOTE — Telephone Encounter (Signed)
Patient is requesting a refill on Omeprazole sent to [facility]

## 2022-11-05 ENCOUNTER — Ambulatory Visit: Payer: Medicaid Other | Admitting: Allergy and Immunology

## 2022-12-07 HISTORY — PX: SHOULDER SURGERY: SHX246

## 2022-12-17 ENCOUNTER — Ambulatory Visit: Payer: Medicaid Other | Admitting: Allergy and Immunology

## 2022-12-17 ENCOUNTER — Other Ambulatory Visit: Payer: Self-pay | Admitting: Allergy and Immunology

## 2022-12-17 ENCOUNTER — Encounter: Payer: Self-pay | Admitting: Allergy and Immunology

## 2022-12-17 VITALS — BP 118/84 | HR 84 | Resp 14 | Ht 65.0 in | Wt 213.8 lb

## 2022-12-17 DIAGNOSIS — J4550 Severe persistent asthma, uncomplicated: Secondary | ICD-10-CM | POA: Diagnosis not present

## 2022-12-17 DIAGNOSIS — F17210 Nicotine dependence, cigarettes, uncomplicated: Secondary | ICD-10-CM

## 2022-12-17 DIAGNOSIS — J3089 Other allergic rhinitis: Secondary | ICD-10-CM | POA: Diagnosis not present

## 2022-12-17 DIAGNOSIS — K219 Gastro-esophageal reflux disease without esophagitis: Secondary | ICD-10-CM

## 2022-12-17 MED ORDER — NYSTATIN 100000 UNIT/ML MT SUSP: OROMUCOSAL | 5 refills | Status: DC

## 2022-12-17 MED ORDER — ASMANEX (30 METERED DOSES) 220 MCG/ACT IN AEPB: INHALATION_SPRAY | RESPIRATORY_TRACT | 5 refills | Status: AC

## 2022-12-17 NOTE — Progress Notes (Signed)
[place] [facility] [place] - [place] - [place] - [place]   Follow-up Note  Referring Provider: Galvin Proffer, MD Primary Provider: Galvin Proffer, MD Date of Office Visit: 12/17/2022  Subjective:   Veronica Roy (DOB: 04-30-1975) is a 47 y.o. female who [facility] in re-evaluation of the following:  HPI: Rayden presents to this clinic in evaluation of asthma, tobacco smoking, rhinitis, LPR, thrush, history of urticaria, history of eosinophilia.  I last saw her in this clinic 17 March 2022.  She believes that she is doing very well at this point in time regarding her respiratory track while using her tezepelumab injections.  She was unable to tolerate her Markus Daft as she was unable to tolerate Trelegy in the past because of migraines and now she just uses a Spiriva every day and uses albuterol a few times a day.  She continues to smoke a few cigarettes per day.  It does not sound as though she has required a systemic steroid to treat an exacerbation of asthma.  She apparently contracted influenza March 2024 but had no long-term sequela from that infection.  She believes that her nose is doing very well at this point in time and it does not sound as though she has used a antibiotic to treat a episode of sinusitis.  Her reflux is under very good control while using a proton pump inhibitor twice a day.  She does not need to use any famotidine.  She informs me that she does not receive the flu vaccine.  Allergies as of 12/17/2022       Reactions   Ativan [lorazepam] Anaphylaxis   Codeine Anaphylaxis   Penicillins Anaphylaxis   Aspirin Other (See Comments)   Other reaction(s): nosebleeds Other reaction(s): nosebleeds   Cefdinir Other (See Comments)   Other reaction(s): diarrhea/vomiting Other reaction(s): diarrhea/vomiting   Chlorphen-pe-acetaminophen Other (See Comments)   Latex    Meloxicam Other (See Comments)   Other  reaction(s): nosebleeds Other reaction(s): nosebleeds   Nsaids    Other reaction(s): nausea   Phenergan [promethazine Hcl] Other (See Comments)   agitation   Shellfish Allergy    Vitamin D (calciferol) Other (See Comments)        Medication List     Accu-Chek FastClix Lancets Misc USE 1 LANCET TO CHECK GLUCOSE ONCE DAILY   Accu-Chek Guide test strip Generic drug: glucose blood daily.   busPIRone 10 MG tablet Commonly known as: BUSPAR Take 10 mg by mouth 2 (two) times daily.   cetirizine 10 MG tablet Commonly known as: ZYRTEC TAKE 1-2 TABLET 1-2 TIMES PER DAY   cyclobenzaprine 10 MG tablet Commonly known as: FLEXERIL 1 tablet as needed   gabapentin 300 MG capsule Commonly known as: NEURONTIN Take by mouth.   HYDROcodone-acetaminophen 10-325 MG tablet Commonly known as: NORCO SMARTSIG:1 Tablet(s) By Mouth Every 8-12 Hours PRN   ipratropium-albuterol 0.5-2.5 (3) MG/3ML Soln Commonly known as: DUONEB USE 1 AMPULE IN NEBULIZER EVERY 6 HOURS   lidocaine 5 % Commonly known as: LIDODERM SMARTSIG:3 Patch(s) Topical Every 12 Hours   lisinopril 5 MG tablet Commonly known as: ZESTRIL Take 5 mg by mouth daily.   montelukast 10 MG tablet Commonly known as: SINGULAIR Take 1 tablet by mouth daily.   nystatin 100000 UNIT/ML suspension Commonly known as: MYCOSTATIN 5 mls swish and swallow after Breztri use 2 times per day   omeprazole 40 MG capsule Commonly known as: PRILOSEC Take one capsule  twice daily as directed.   ondansetron 4 MG disintegrating tablet Commonly known as: ZOFRAN-ODT Place 1 tablet every 6-8 hours by translingual route as needed, for nausea/vomiting.   ondansetron 8 MG tablet Commonly known as: ZOFRAN Take 8 mg by mouth 2 (two) times daily.   oxyCODONE 5 MG immediate release tablet Commonly known as: Oxy IR/ROXICODONE Take 1 tablet every 6 hours by oral route as needed, for moderate pain.   Pharmacist Choice Autocode Sys w/Device  Kit check blood sugar QD   prazosin 2 MG capsule Commonly known as: MINIPRESS Take 2 mg by mouth at bedtime.   Spiriva Respimat 1.25 MCG/ACT Aers Generic drug: Tiotropium Bromide Monohydrate Inhale two puffs once daily to prevent cough or wheeze. Rinse mouth after use.   Tezspire 210 MG/1. Soaj Generic drug: Tezepelumab-ekko Inject 210 mg into the skin every 28 (twenty-eight) days.   TYLENOL PO Take by mouth.   Ventolin HFA 108 (90 Base) MCG/ACT inhaler Generic drug: albuterol Inhale two puffs every 4-6 hours if needed for cough or wheeze.   albuterol (2.5 MG/3ML) 0.083% nebulizer solution Commonly known as: PROVENTIL INHALE 1 VIAL (2.5 MG TOTAL) INTO THE LUNGS 3 (THREE) TIMES DAILY.   Vilazodone HCl 40 MG Tabs Commonly known as: VIIBRYD Take 40 mg by mouth daily.   Vitamin D (Ergocalciferol) 1.25 MG (50000 UNIT) Caps capsule Commonly known as: DRISDOL Take 2 capsules by mouth once a week.    Past Medical History:  Diagnosis Date   Anxiety    Lyme disease    [place] spotted fever    Seizures (HCC)    Syncope     Past Surgical History:  Procedure Laterality Date   CHOLECYSTECTOMY     SHOULDER SURGERY Right 12/07/2022    Review of systems negative except as noted in HPI / PMHx or noted below:  Review of Systems  Constitutional: Negative.   HENT: Negative.    Eyes: Negative.   Respiratory: Negative.    Cardiovascular: Negative.   Gastrointestinal: Negative.   Genitourinary: Negative.   Musculoskeletal: Negative.   Skin: Negative.   Neurological: Negative.   Endo/Heme/Allergies: Negative.   Psychiatric/Behavioral: Negative.       Objective:   Vitals:   12/17/22 1356  BP: 118/84  Pulse: 84  Resp: 14  SpO2: 95%   Height: 5\' 5"  (165.1 cm)  Weight: 213 lb 12.8 oz (97 kg)   Physical Exam Constitutional:      Appearance: She is not diaphoretic.  HENT:     Head: Normocephalic.     Right Ear: Tympanic membrane, ear canal and external  ear normal.     Left Ear: Tympanic membrane, ear canal and external ear normal.     Nose: Nose normal. No mucosal edema or rhinorrhea.     Mouth/Throat:     Pharynx: Uvula midline. No oropharyngeal exudate.  Eyes:     Conjunctiva/sclera: Conjunctivae normal.  Neck:     Thyroid: No thyromegaly.     Trachea: Trachea normal. No tracheal tenderness or tracheal deviation.  Cardiovascular:     Rate and Rhythm: Normal rate and regular rhythm.     Heart sounds: Normal heart sounds, S1 normal and S2 normal. No murmur heard. Pulmonary:     Effort: No respiratory distress.     Breath sounds: Normal breath sounds. No stridor. No wheezing or rales.  Lymphadenopathy:     Head:     Right side of head: No tonsillar adenopathy.     Left side  of head: No tonsillar adenopathy.     Cervical: No cervical adenopathy.  Skin:    Findings: No erythema or rash.     Nails: There is no clubbing.  Neurological:     Mental Status: She is alert.     Diagnostics: Spirometry was performed and demonstrated an FEV1 of 1.65 at 57 % of predicted.  Assessment and Plan:   1. Not well controlled severe persistent asthma   2. Perennial allergic rhinitis   3. LPRD (laryngopharyngeal reflux disease)   4. Light tobacco smoker <10 cigarettes per day     1.  Allergen avoidance measures - Dust mite, pollen, smoke exposure  2.  Continue to remain away from smoke exposure as much as possible  3.  Continue to treat and prevent inflammation of airway:   A. Spiriva 1.25 respimat - 2 inhalations 1 time per day  B. Asmanex 220 twisthaler - 1 inhalation 1 time per day  C. Montelukast 10 mg - 1 tablet 1 time per day  D. Tezepelumab injections every 4 weeks  4.  Continue to treat and prevent thrush:   A. Nystatin - 5 mls swish and swallow after inhaler use  5.  Continue to treat and prevent reflux:   A. Omeprezole 40 mg - 1 tablet 2 times per day   6. If needed:   A. Albuterol HFA - 2 inhalations every 4-6  hours  B. Cetirizine 10 mg - 1-2 tablets 1-2 times per day (MAX=40 mg/day)  C. Famotidine 40 mg - 1 tablet in evening  7. Return to clinic in 6 months or earlier if problem.  Although Archana is doing better while using tezepelumab injections regarding her airway disease I think that she could even do better if she would add in some inhaled steroids in addition to the other agents that she inhales.  She has been intolerant of using Breztri and Trelegy and a few other inhalers and we will start her on Asmanex and Spiriva in combination.  She can continue on therapy with omeprazole for her reflux which appears to be working well.  I had a talk with her today about eliminating smoke exposure is much as possible.  If she does well with the plan noted above we will see her back in this clinic in 6 months or earlier if there is a problem.  Laurette Schimke, MD Allergy / Immunology [facility]

## 2022-12-17 NOTE — Patient Instructions (Addendum)
  1.  Allergen avoidance measures - Dust mite, pollen, smoke exposure  2.  Continue to remain away from smoke exposure as much as possible  3.  Continue to treat and prevent inflammation of airway:   A. Spiriva 1.25 respimat - 2 inhalations 1 time per day  B. Asmanex 220 twisthaler - 1 inhalation 1 time per day  C. Montelukast 10 mg - 1 tablet 1 time per day  D. Tezepelumab injections every 4 weeks  4.  Continue to treat and prevent thrush:   A. Nystatin - 5 mls swish and swallow after inhaler use  5.  Continue to treat and prevent reflux:   A. Omeprezole 40 mg - 1 tablet 2 times per day   6. If needed:   A. Albuterol HFA - 2 inhalations every 4-6 hours  B. Cetirizine 10 mg - 1-2 tablets 1-2 times per day (MAX=40 mg/day)  C. Famotidine 40 mg - 1 tablet in evening  7. Return to clinic in 6 months or earlier if problem.

## 2022-12-21 ENCOUNTER — Encounter: Payer: Self-pay | Admitting: Allergy and Immunology

## 2022-12-23 ENCOUNTER — Ambulatory Visit: Payer: Medicaid Other | Admitting: Allergy and Immunology

## 2023-01-20 ENCOUNTER — Other Ambulatory Visit: Payer: Self-pay

## 2023-01-20 MED ORDER — VENTOLIN HFA 108 (90 BASE) MCG/ACT IN AERS: INHALATION_SPRAY | RESPIRATORY_TRACT | 1 refills | Status: DC

## 2023-05-27 ENCOUNTER — Other Ambulatory Visit: Payer: Self-pay | Admitting: *Deleted

## 2023-05-27 MED ORDER — ALBUTEROL SULFATE (2.5 MG/3ML) 0.083% IN NEBU: INHALATION_SOLUTION | RESPIRATORY_TRACT | 0 refills | Status: AC

## 2023-06-16 ENCOUNTER — Ambulatory Visit: Payer: Medicaid Other | Admitting: Allergy and Immunology

## 2023-06-24 ENCOUNTER — Other Ambulatory Visit: Payer: Self-pay | Admitting: *Deleted

## 2023-06-24 ENCOUNTER — Telehealth: Payer: Self-pay | Admitting: Allergy and Immunology

## 2023-06-24 MED ORDER — AZITHROMYCIN 500 MG PO TABS: ORAL_TABLET | ORAL | 0 refills | Status: DC

## 2023-06-24 MED ORDER — PREDNISONE 10 MG PO TABS: ORAL_TABLET | ORAL | 0 refills | Status: AC

## 2023-06-24 NOTE — Telephone Encounter (Signed)
 Called but unable to leave a voicemail. Mailbox not set up yet.

## 2023-06-24 NOTE — Telephone Encounter (Signed)
 Patient states since yesterday afternoon she's had a headache, cough and has had yellow-green nose drainage. She is requesting an antibiotic.

## 2023-06-24 NOTE — Telephone Encounter (Signed)
 RX sent and rx sent.

## 2023-10-25 ENCOUNTER — Encounter: Payer: Self-pay | Admitting: Allergy and Immunology

## 2023-10-25 ENCOUNTER — Ambulatory Visit: Admitting: Allergy and Immunology

## 2023-10-25 VITALS — BP 118/76 | HR 92 | Resp 16

## 2023-10-25 DIAGNOSIS — J4550 Severe persistent asthma, uncomplicated: Secondary | ICD-10-CM | POA: Diagnosis not present

## 2023-10-25 DIAGNOSIS — F17210 Nicotine dependence, cigarettes, uncomplicated: Secondary | ICD-10-CM | POA: Diagnosis not present

## 2023-10-25 DIAGNOSIS — K219 Gastro-esophageal reflux disease without esophagitis: Secondary | ICD-10-CM

## 2023-10-25 DIAGNOSIS — J3089 Other allergic rhinitis: Secondary | ICD-10-CM | POA: Diagnosis not present

## 2023-10-25 DIAGNOSIS — D7219 Other eosinophilia: Secondary | ICD-10-CM

## 2023-10-25 DIAGNOSIS — B370 Candidal stomatitis: Secondary | ICD-10-CM

## 2023-10-25 NOTE — Patient Instructions (Addendum)
  1.  Allergen avoidance measures - Dust mite, pollen, smoke exposure  2.  Continue to remain away from smoke exposure as much as possible  3.  Continue to treat and prevent inflammation of airway:   A. RESTART Spiriva  1.25 respimat - 2 inhalations 1 time per day  B. START QVAR 80 - 2 inhalation 1 time per day  C. Montelukast 10 mg - 1 tablet 1 time per day  D. RESTART Tezepelumab  injections every 4 weeks - TAMMY  E. START Prednisone  10 mg - 1 tablet daily x 5 days only  4.  Continue to treat and prevent thrush:   A. Nystatin  - 5 mls swish and swallow after inhaler use  5.  Continue to treat and prevent reflux:   A. Omeprezole 40 mg - 1 tablet 2 times per day   6. If needed:   A. Albuterol  HFA - 2 inhalations or DUONEB every 4-6 hours  B. Cetirizine  10 mg - 1-2 tablets 1-2 times per day (MAX=40 mg/day)  C. Famotidine  40 mg - 1 tablet in evening  D. Pataday - 1 drop each eye 1 time per day  7. Return to clinic in 6 months or earlier if problem.  8. Influenza = Tamiflu. Covid = Paxlovid

## 2023-10-25 NOTE — Progress Notes (Unsigned)
 [name] [facility] [place] - [place] - [place] - [place]   Follow-up Note  Referring Provider: Pia Kerney SQUIBB, MD Primary Provider: Pia Kerney SQUIBB, MD Date of Office Visit: 10/25/2023  Subjective:   Veronica Roy (DOB: 21-Apr-1975) is a 48 y.o. female who [facility] in re-evaluation of the following:  HPI: Jerel returns to this clinic in evaluation of asthma, tobacco smoking, rhinitis, LPR, thrush, history of urticaria, history of eosinophilia.  I last saw her in the clinic 17 December 2022.  She was out cutting her lawn and using a weed eater about 6 days ago and as expected she has really bad itchy eyes, coughing, wheezing, and irritated eyes.  Even before this weed eater exposure she was having difficulties with her lungs and her nose.  She had constant sneezing and she had wheezing and coughing and this all correlates with her discontinuing the use of her tezepelumab  injections in January 2025.  Apparently there was an insurance issue for acquiring her tezepelumab  injections that prevented her from receiving this medication.  She definitely does a lot better when using tezepelumab  as her requirement for albuterol , currently 6 times per day, goes down very significantly and she only uses that medication about 1 time per day.  She continues to smoke currently at 4 cigarettes/day.  Her reflux is under very good control on her current plan of therapy.  She will use her nystatin  after she uses inhalers and that is kept her from developing thrush.  She is only using albuterol  at this point and is not using Spiriva  and an inhaled steroid.  We tried to get her a combination of Spiriva  and Asmanex .  She cannot tolerate Trelegy or Symbicort  or Breztri  because of massive headaches.  Allergies as of 10/25/2023       Reactions   Ativan [lorazepam] Anaphylaxis   Codeine Anaphylaxis   Penicillins Anaphylaxis   Aspirin Other (See Comments)    Other reaction(s): nosebleeds Other reaction(s): nosebleeds   Cefdinir Other (See Comments)   Other reaction(s): diarrhea/vomiting Other reaction(s): diarrhea/vomiting   Chlorphen-pe-acetaminophen  Other (See Comments)   Latex    Meloxicam Other (See Comments)   Other reaction(s): nosebleeds Other reaction(s): nosebleeds   Nsaids    Other reaction(s): nausea   Phenergan [promethazine Hcl] Other (See Comments)   agitation   Shellfish Allergy    Vitamin D (calciferol) Other (See Comments)        Medication List    Accu-Chek FastClix Lancets Misc USE 1 LANCET TO CHECK GLUCOSE ONCE DAILY   Accu-Chek Guide test strip Generic drug: glucose blood daily.   Asmanex  (30 Metered Doses) 220 MCG/ACT inhaler Generic drug: mometasone Inhale one dose once daily to prevent cough or wheeze.  Rinse, gargle, and spit after use.   azithromycin  500 MG tablet Commonly known as: ZITHROMAX  Take one tablet once daily for 3 days   busPIRone 10 MG tablet Commonly known as: BUSPAR Take 10 mg by mouth 2 (two) times daily.   cetirizine  10 MG tablet Commonly known as: ZYRTEC  TAKE 1-2 TABLET 1-2 TIMES PER DAY   cyclobenzaprine 10 MG tablet Commonly known as: FLEXERIL 1 tablet as needed   gabapentin 300 MG capsule Commonly known as: NEURONTIN Take by mouth.   HYDROcodone-acetaminophen  10-325 MG tablet Commonly known as: NORCO SMARTSIG:1 Tablet(s) By Mouth Every 8-12 Hours PRN   ipratropium-albuterol  0.5-2.5 (3) MG/3ML Soln Commonly known as: DUONEB USE 1 AMPULE IN NEBULIZER EVERY 6 HOURS  lidocaine 5 % Commonly known as: LIDODERM SMARTSIG:3 Patch(s) Topical Every 12 Hours   lisinopril 5 MG tablet Commonly known as: ZESTRIL Take 5 mg by mouth daily.   montelukast 10 MG tablet Commonly known as: SINGULAIR Take 1 tablet by mouth daily.   nystatin  100000 UNIT/ML suspension Commonly known as: MYCOSTATIN  5 MLS SWISH AND SWALLOW AFTER INHALER USE AS DIRECTED.   omeprazole  40  MG capsule Commonly known as: PRILOSEC Take one capsule twice daily as directed.   ondansetron 4 MG disintegrating tablet Commonly known as: ZOFRAN-ODT Place 1 tablet every 6-8 hours by translingual route as needed, for nausea/vomiting.   ondansetron 8 MG tablet Commonly known as: ZOFRAN Take 8 mg by mouth 2 (two) times daily.   oxyCODONE  5 MG immediate release tablet Commonly known as: Oxy IR/ROXICODONE  Take 1 tablet every 6 hours by oral route as needed, for moderate pain.   Pharmacist Choice Autocode Sys w/Device Kit check blood sugar QD   prazosin 2 MG capsule Commonly known as: MINIPRESS Take 2 mg by mouth at bedtime.   predniSONE  10 MG tablet Commonly known as: DELTASONE  Take one tablet once daily   Spiriva  Respimat 1.25 MCG/ACT Aers Generic drug: Tiotropium Bromide Monohydrate  Inhale two puffs once daily to prevent cough or wheeze. Rinse mouth after use.   Tezspire  210 MG/1. Soaj Generic drug: Tezepelumab -ekko Inject 210 mg into the skin every 28 (twenty-eight) days.   TYLENOL  PO Take by mouth.   Ventolin  HFA 108 (90 Base) MCG/ACT inhaler Generic drug: albuterol  Inhale two puffs every 4-6 hours if needed for cough or wheeze.   albuterol  (2.5 MG/3ML) 0.083% nebulizer solution Commonly known as: PROVENTIL  Use one vial in the nebulizer every 4-6 hours if needed for cough or wheeze.   Vilazodone HCl 40 MG Tabs Commonly known as: VIIBRYD Take 40 mg by mouth daily.   Vitamin D (Ergocalciferol) 1.25 MG (50000 UNIT) Caps capsule Commonly known as: DRISDOL Take 2 capsules by mouth once a week.    Past Medical History:  Diagnosis Date   Anxiety    Lyme disease    [place] spotted fever    Seizures (HCC)    Syncope     Past Surgical History:  Procedure Laterality Date   CHOLECYSTECTOMY     SHOULDER SURGERY Right 12/07/2022    Review of systems negative except as noted in HPI / PMHx or noted below:  Review of Systems  Constitutional:  Negative.   HENT: Negative.    Eyes: Negative.   Respiratory: Negative.    Cardiovascular: Negative.   Gastrointestinal: Negative.   Genitourinary: Negative.   Musculoskeletal: Negative.   Skin: Negative.   Neurological: Negative.   Endo/Heme/Allergies: Negative.   Psychiatric/Behavioral: Negative.       Objective:   Vitals:   10/25/23 1148  BP: 118/76  Pulse: 92  Resp: 16  SpO2: 96%          Physical Exam Constitutional:      Appearance: She is not diaphoretic.  HENT:     Head: Normocephalic.     Right Ear: Tympanic membrane, ear canal and external ear normal.     Left Ear: Tympanic membrane, ear canal and external ear normal.     Nose: Nose normal. No mucosal edema or rhinorrhea.     Mouth/Throat:     Pharynx: Uvula midline. No oropharyngeal exudate.  Eyes:     Conjunctiva/sclera: Conjunctivae normal.  Neck:     Thyroid: No thyromegaly.     Trachea:  Trachea normal. No tracheal tenderness or tracheal deviation.  Cardiovascular:     Rate and Rhythm: Normal rate and regular rhythm.     Heart sounds: Normal heart sounds, S1 normal and S2 normal. No murmur heard. Pulmonary:     Effort: No respiratory distress.     Breath sounds: No stridor. Wheezing (bilateral expiratory wheezing) present. No rales.  Lymphadenopathy:     Head:     Right side of head: No tonsillar adenopathy.     Left side of head: No tonsillar adenopathy.     Cervical: No cervical adenopathy.  Skin:    Findings: No erythema or rash.     Nails: There is no clubbing.  Neurological:     Mental Status: She is alert.     Diagnostics: Spirometry was performed and demonstrated an FEV1 of 1.44 at 50 % of predicted.  Assessment and Plan:   1. Not well controlled severe persistent asthma   2. Perennial allergic rhinitis   3. LPRD (laryngopharyngeal reflux disease)   4. Light tobacco smoker <10 cigarettes per day   5. Thrush   6. Other eosinophilia    1.  Allergen avoidance measures - Dust  mite, pollen, smoke exposure  2.  Continue to remain away from smoke exposure as much as possible  3.  Continue to treat and prevent inflammation of airway:   A. RESTART Spiriva  1.25 respimat - 2 inhalations 1 time per day  B. START QVAR 80 - 2 inhalation 1 time per day  C. Montelukast 10 mg - 1 tablet 1 time per day  D. RESTART Tezepelumab  injections every 4 weeks - TAMMY  E. START Prednisone  10 mg - 1 tablet daily x 5 days only  4.  Continue to treat and prevent thrush:   A. Nystatin  - 5 mls swish and swallow after inhaler use  5.  Continue to treat and prevent reflux:   A. Omeprezole 40 mg - 1 tablet 2 times per day   6. If needed:   A. Albuterol  HFA - 2 inhalations or DUONEB every 4-6 hours  B. Cetirizine  10 mg - 1-2 tablets 1-2 times per day (MAX=40 mg/day)  C. Famotidine  40 mg - 1 tablet in evening  D. Pataday - 1 drop each eye 1 time per day  7. Return to clinic in 6 months or earlier if problem.  8. Influenza = Tamiflu. Covid = Paxlovid  Malvina has an inflamed airway and conjunctiva and this loss of control appears to correlate with her discontinuation of tezepelumab  injections.  We will place her back on tezepelumab  injections once we get her insurance straightened out.  I have given her a very short course of steroids until that point in time to help with her inflammation.  And, we are going to have her use a combination of Spiriva  and Qvar on a pretty consistent basis.  Assuming she does well we will see her back in this clinic in 6 months or earlier if there is a problem.  Camellia Denis, MD Allergy / Immunology [facility]

## 2023-10-26 ENCOUNTER — Encounter: Payer: Self-pay | Admitting: Allergy and Immunology

## 2023-11-02 ENCOUNTER — Telehealth: Payer: Self-pay | Admitting: *Deleted

## 2023-11-02 MED ORDER — TEZSPIRE 210 MG/1.91ML ~~LOC~~ SOAJ: 210.0000 mg | SUBCUTANEOUS | 11 refills | Status: AC

## 2023-11-02 NOTE — Telephone Encounter (Signed)
 Tried to call patient to advise approval for Tezspire  and tx to [facility] to restart but unable to leave message or send Mychart message to patient

## 2023-11-04 ENCOUNTER — Telehealth: Payer: Self-pay | Admitting: *Deleted

## 2023-11-04 MED ORDER — VENTOLIN HFA 108 (90 BASE) MCG/ACT IN AERS: INHALATION_SPRAY | RESPIRATORY_TRACT | 1 refills | Status: DC

## 2023-11-04 MED ORDER — VENTOLIN HFA 108 (90 BASE) MCG/ACT IN AERS: INHALATION_SPRAY | RESPIRATORY_TRACT | 1 refills | Status: AC

## 2023-11-04 MED ORDER — OMEPRAZOLE 40 MG PO CPDR: DELAYED_RELEASE_CAPSULE | ORAL | 5 refills | Status: DC

## 2023-11-04 MED ORDER — AZITHROMYCIN 500 MG PO TABS: ORAL_TABLET | ORAL | 0 refills | Status: AC

## 2023-11-04 MED ORDER — MONTELUKAST SODIUM 10 MG PO TABS: 10.0000 mg | ORAL_TABLET | Freq: Every day | ORAL | 5 refills | Status: AC

## 2023-11-04 MED ORDER — PREDNISONE 10 MG PO TABS: ORAL_TABLET | ORAL | 0 refills | Status: AC

## 2023-11-04 MED ORDER — CETIRIZINE HCL 10 MG PO TABS: ORAL_TABLET | ORAL | 5 refills | Status: DC

## 2023-11-04 MED ORDER — MONTELUKAST SODIUM 10 MG PO TABS: 10.0000 mg | ORAL_TABLET | Freq: Every day | ORAL | 5 refills | Status: DC

## 2023-11-04 MED ORDER — OMEPRAZOLE 40 MG PO CPDR: DELAYED_RELEASE_CAPSULE | ORAL | 5 refills | Status: AC

## 2023-11-04 MED ORDER — CETIRIZINE HCL 10 MG PO TABS: ORAL_TABLET | ORAL | 5 refills | Status: AC

## 2023-11-04 NOTE — Telephone Encounter (Signed)
 Patient advised she has had sinus congestion, green drainage and pressure for 5 days. Requesting antibiotic to be call in [facility]. Have sent refills of her medications and advised of approval for Tezspire  to contact [facility] to send same out

## 2023-11-04 NOTE — Addendum Note (Signed)
 Addended by: OTHA MADELIN HERO on: 11/04/2023 03:05 PM   Modules accepted: Orders

## 2023-11-04 NOTE — Telephone Encounter (Signed)
 Called and informed patient of Dr. Rowan  message.  Sent ERX to [facility] as requested.

## 2023-11-04 NOTE — Addendum Note (Signed)
 Addended by: CASIMIR CHIQUITA POUR on: 11/04/2023 05:28 PM   Modules accepted: Orders

## 2023-11-04 NOTE — Addendum Note (Signed)
 Addended by: OTHA MADELIN HERO on: 11/04/2023 02:28 PM   Modules accepted: Orders

## 2024-01-03 ENCOUNTER — Ambulatory Visit (INDEPENDENT_AMBULATORY_CARE_PROVIDER_SITE_OTHER): Payer: Self-pay | Admitting: *Deleted

## 2024-01-03 DIAGNOSIS — J4550 Severe persistent asthma, uncomplicated: Secondary | ICD-10-CM
